# Patient Record
Sex: Female | Born: 1950 | Race: White | Hispanic: No | Marital: Married | Smoking: Never smoker
Health system: Southern US, Community
[De-identification: ages and names within clinical notes are randomized; demographics above are authoritative.]

## PROBLEM LIST (undated history)

## (undated) DIAGNOSIS — IMO0002 Reserved for concepts with insufficient information to code with codable children: Secondary | ICD-10-CM

## (undated) DIAGNOSIS — I10 Essential (primary) hypertension: Secondary | ICD-10-CM

## (undated) DIAGNOSIS — N870 Mild cervical dysplasia: Secondary | ICD-10-CM

## (undated) DIAGNOSIS — N200 Calculus of kidney: Secondary | ICD-10-CM

## (undated) HISTORY — DX: Mild cervical dysplasia: N87.0

## (undated) HISTORY — DX: Reserved for concepts with insufficient information to code with codable children: IMO0002

## (undated) HISTORY — PX: OTHER SURGICAL HISTORY: SHX169

## (undated) HISTORY — DX: Calculus of kidney: N20.0

## (undated) HISTORY — DX: Essential (primary) hypertension: I10

## (undated) HISTORY — PX: GYNECOLOGIC CRYOSURGERY: SHX857

---

## 1998-08-18 HISTORY — PX: ABDOMINAL HYSTERECTOMY: SHX81

## 1998-11-15 ENCOUNTER — Encounter: Payer: Self-pay | Admitting: Obstetrics and Gynecology

## 1998-11-20 ENCOUNTER — Inpatient Hospital Stay (HOSPITAL_COMMUNITY): Admission: RE | Admit: 1998-11-20 | Discharge: 1998-11-23 | Payer: Self-pay | Admitting: Obstetrics and Gynecology

## 1999-12-27 ENCOUNTER — Encounter: Admission: RE | Admit: 1999-12-27 | Discharge: 1999-12-27 | Payer: Self-pay | Admitting: Family Medicine

## 1999-12-27 ENCOUNTER — Encounter: Payer: Self-pay | Admitting: Family Medicine

## 2001-04-07 ENCOUNTER — Other Ambulatory Visit: Admission: RE | Admit: 2001-04-07 | Discharge: 2001-04-07 | Payer: Self-pay | Admitting: Obstetrics and Gynecology

## 2002-04-27 ENCOUNTER — Other Ambulatory Visit: Admission: RE | Admit: 2002-04-27 | Discharge: 2002-04-27 | Payer: Self-pay | Admitting: Obstetrics and Gynecology

## 2003-07-07 ENCOUNTER — Other Ambulatory Visit: Admission: RE | Admit: 2003-07-07 | Discharge: 2003-07-07 | Payer: Self-pay | Admitting: Obstetrics and Gynecology

## 2004-07-16 ENCOUNTER — Other Ambulatory Visit: Admission: RE | Admit: 2004-07-16 | Discharge: 2004-07-16 | Payer: Self-pay | Admitting: Obstetrics and Gynecology

## 2005-07-17 ENCOUNTER — Other Ambulatory Visit: Admission: RE | Admit: 2005-07-17 | Discharge: 2005-07-17 | Payer: Self-pay | Admitting: Obstetrics and Gynecology

## 2006-07-20 ENCOUNTER — Other Ambulatory Visit: Admission: RE | Admit: 2006-07-20 | Discharge: 2006-07-20 | Payer: Self-pay | Admitting: Obstetrics and Gynecology

## 2008-08-15 ENCOUNTER — Encounter: Payer: Self-pay | Admitting: Obstetrics and Gynecology

## 2008-08-15 ENCOUNTER — Ambulatory Visit: Payer: Self-pay | Admitting: Obstetrics and Gynecology

## 2008-08-15 ENCOUNTER — Other Ambulatory Visit: Admission: RE | Admit: 2008-08-15 | Discharge: 2008-08-15 | Payer: Self-pay | Admitting: Obstetrics and Gynecology

## 2008-09-11 ENCOUNTER — Ambulatory Visit: Payer: Self-pay | Admitting: Obstetrics and Gynecology

## 2009-09-04 ENCOUNTER — Other Ambulatory Visit: Admission: RE | Admit: 2009-09-04 | Discharge: 2009-09-04 | Payer: Self-pay | Admitting: Obstetrics and Gynecology

## 2009-09-04 ENCOUNTER — Ambulatory Visit: Payer: Self-pay | Admitting: Obstetrics and Gynecology

## 2010-09-07 ENCOUNTER — Emergency Department (HOSPITAL_BASED_OUTPATIENT_CLINIC_OR_DEPARTMENT_OTHER)
Admission: EM | Admit: 2010-09-07 | Discharge: 2010-09-07 | Payer: Self-pay | Source: Home / Self Care | Admitting: Emergency Medicine

## 2010-09-10 LAB — URINALYSIS, ROUTINE W REFLEX MICROSCOPIC
Bilirubin Urine: NEGATIVE
Ketones, ur: NEGATIVE mg/dL
Leukocytes, UA: NEGATIVE
Nitrite: NEGATIVE
Protein, ur: 30 mg/dL — AB
Specific Gravity, Urine: 1.031 — ABNORMAL HIGH (ref 1.005–1.030)
Urine Glucose, Fasting: NEGATIVE mg/dL
Urobilinogen, UA: 0.2 mg/dL (ref 0.0–1.0)
pH: 5.5 (ref 5.0–8.0)

## 2010-09-10 LAB — URINE MICROSCOPIC-ADD ON

## 2010-10-24 ENCOUNTER — Encounter (INDEPENDENT_AMBULATORY_CARE_PROVIDER_SITE_OTHER): Payer: BC Managed Care – PPO | Admitting: Obstetrics and Gynecology

## 2010-10-24 ENCOUNTER — Other Ambulatory Visit: Payer: Self-pay | Admitting: Obstetrics and Gynecology

## 2010-10-24 ENCOUNTER — Other Ambulatory Visit (HOSPITAL_COMMUNITY)
Admission: RE | Admit: 2010-10-24 | Discharge: 2010-10-24 | Disposition: A | Discharge status: Home / Self Care | Payer: BC Managed Care – PPO | Source: Ambulatory Visit | Attending: Obstetrics and Gynecology | Admitting: Obstetrics and Gynecology

## 2010-10-24 DIAGNOSIS — L659 Nonscarring hair loss, unspecified: Secondary | ICD-10-CM

## 2010-10-24 DIAGNOSIS — Z124 Encounter for screening for malignant neoplasm of cervix: Secondary | ICD-10-CM | POA: Insufficient documentation

## 2010-10-24 DIAGNOSIS — Z01419 Encounter for gynecological examination (general) (routine) without abnormal findings: Secondary | ICD-10-CM

## 2010-10-24 DIAGNOSIS — Z13220 Encounter for screening for lipoid disorders: Secondary | ICD-10-CM

## 2010-12-15 ENCOUNTER — Emergency Department (HOSPITAL_BASED_OUTPATIENT_CLINIC_OR_DEPARTMENT_OTHER)
Admission: EM | Admit: 2010-12-15 | Discharge: 2010-12-15 | Disposition: A | Discharge status: Home / Self Care | Payer: BC Managed Care – PPO | Attending: Emergency Medicine | Admitting: Emergency Medicine

## 2010-12-15 DIAGNOSIS — H81319 Aural vertigo, unspecified ear: Secondary | ICD-10-CM | POA: Insufficient documentation

## 2010-12-15 LAB — DIFFERENTIAL
Basophils Absolute: 0 10*3/uL (ref 0.0–0.1)
Basophils Relative: 0 % (ref 0–1)
Eosinophils Absolute: 0.1 10*3/uL (ref 0.0–0.7)
Eosinophils Relative: 1 % (ref 0–5)
Lymphocytes Relative: 27 % (ref 12–46)
Lymphs Abs: 2.8 10*3/uL (ref 0.7–4.0)
Monocytes Absolute: 0.8 10*3/uL (ref 0.1–1.0)
Monocytes Relative: 8 % (ref 3–12)
Neutro Abs: 6.6 10*3/uL (ref 1.7–7.7)
Neutrophils Relative %: 64 % (ref 43–77)

## 2010-12-15 LAB — CBC
HCT: 39.3 % (ref 36.0–46.0)
Hemoglobin: 13.3 g/dL (ref 12.0–15.0)
MCH: 29.3 pg (ref 26.0–34.0)
MCHC: 33.8 g/dL (ref 30.0–36.0)
MCV: 86.6 fL (ref 78.0–100.0)
Platelets: 292 10*3/uL (ref 150–400)
RBC: 4.54 MIL/uL (ref 3.87–5.11)
RDW: 12.6 % (ref 11.5–15.5)
WBC: 10.3 10*3/uL (ref 4.0–10.5)

## 2010-12-15 LAB — BASIC METABOLIC PANEL
BUN: 8 mg/dL (ref 6–23)
CO2: 28 mEq/L (ref 19–32)
Calcium: 8.8 mg/dL (ref 8.4–10.5)
Chloride: 103 mEq/L (ref 96–112)
Creatinine, Ser: 0.5 mg/dL (ref 0.4–1.2)
GFR calc Af Amer: >60 mL/min (ref >60)
GFR calc non Af Amer: >60 mL/min (ref >60)
Glucose, Bld: 98 mg/dL (ref 70–99)
Potassium: 3.5 mEq/L (ref 3.5–5.1)
Sodium: 143 mEq/L (ref 135–145)

## 2010-12-23 ENCOUNTER — Ambulatory Visit (INDEPENDENT_AMBULATORY_CARE_PROVIDER_SITE_OTHER): Payer: BC Managed Care – PPO | Admitting: Obstetrics and Gynecology

## 2010-12-23 DIAGNOSIS — R5381 Other malaise: Secondary | ICD-10-CM

## 2010-12-23 DIAGNOSIS — L659 Nonscarring hair loss, unspecified: Secondary | ICD-10-CM

## 2010-12-23 DIAGNOSIS — R5383 Other fatigue: Secondary | ICD-10-CM

## 2010-12-23 DIAGNOSIS — E559 Vitamin D deficiency, unspecified: Secondary | ICD-10-CM

## 2011-10-21 ENCOUNTER — Other Ambulatory Visit: Payer: Self-pay

## 2011-10-21 MED ORDER — TRIAMTERENE-HCTZ 37.5-25 MG PO TABS: 1.0000 | ORAL_TABLET | Freq: Every day | ORAL | Status: DC

## 2011-10-21 MED ORDER — ESTROGENS CONJUGATED 0.625 MG PO TABS: 0.6250 mg | ORAL_TABLET | Freq: Every day | ORAL | Status: DC

## 2011-11-04 ENCOUNTER — Other Ambulatory Visit: Payer: Self-pay

## 2011-11-04 ENCOUNTER — Encounter: Payer: Self-pay | Admitting: Obstetrics and Gynecology

## 2011-11-04 NOTE — Telephone Encounter (Signed)
SPOKE WITH GEORGETTE AT PHARMACY. PT. HAS REFILLS LEFT ON BOTH RX'S PT. NEEDS(PREMARIN & GENERIC MAXZIDE). SHE STATES SHE WILL FILL BOTH FOR PT. & I NOTIFIED PT. OF THIS INFO.

## 2011-11-11 ENCOUNTER — Encounter: Payer: Self-pay | Admitting: Gynecology

## 2011-11-11 DIAGNOSIS — N870 Mild cervical dysplasia: Secondary | ICD-10-CM | POA: Insufficient documentation

## 2011-11-20 ENCOUNTER — Encounter: Payer: Self-pay | Admitting: Obstetrics and Gynecology

## 2011-11-20 ENCOUNTER — Ambulatory Visit (INDEPENDENT_AMBULATORY_CARE_PROVIDER_SITE_OTHER): Payer: BC Managed Care – PPO | Admitting: Obstetrics and Gynecology

## 2011-11-20 ENCOUNTER — Other Ambulatory Visit (HOSPITAL_COMMUNITY)
Admission: RE | Admit: 2011-11-20 | Discharge: 2011-11-20 | Disposition: A | Discharge status: Home / Self Care | Payer: BC Managed Care – PPO | Source: Ambulatory Visit | Attending: Obstetrics and Gynecology | Admitting: Obstetrics and Gynecology

## 2011-11-20 VITALS — BP 120/74 | Ht 63.0 in | Wt 165.0 lb

## 2011-11-20 DIAGNOSIS — Z01419 Encounter for gynecological examination (general) (routine) without abnormal findings: Secondary | ICD-10-CM

## 2011-11-20 DIAGNOSIS — N809 Endometriosis, unspecified: Secondary | ICD-10-CM | POA: Insufficient documentation

## 2011-11-20 DIAGNOSIS — N200 Calculus of kidney: Secondary | ICD-10-CM | POA: Insufficient documentation

## 2011-11-20 DIAGNOSIS — Z833 Family history of diabetes mellitus: Secondary | ICD-10-CM

## 2011-11-20 LAB — CBC WITH DIFFERENTIAL/PLATELET
Basophils Absolute: 0 10*3/uL (ref 0.0–0.1)
Basophils Relative: 0 % (ref 0–1)
Eosinophils Absolute: 0.1 10*3/uL (ref 0.0–0.7)
Eosinophils Relative: 1 % (ref 0–5)
HCT: 42.4 % (ref 36.0–46.0)
Hemoglobin: 14 g/dL (ref 12.0–15.0)
Lymphocytes Relative: 21 % (ref 12–46)
Lymphs Abs: 1.9 10*3/uL (ref 0.7–4.0)
MCH: 29.6 pg (ref 26.0–34.0)
MCHC: 33 g/dL (ref 30.0–36.0)
MCV: 89.6 fL (ref 78.0–100.0)
Monocytes Absolute: 0.6 10*3/uL (ref 0.1–1.0)
Monocytes Relative: 7 % (ref 3–12)
Neutro Abs: 6.4 10*3/uL (ref 1.7–7.7)
Neutrophils Relative %: 71 % (ref 43–77)
Platelets: 306 10*3/uL (ref 150–400)
RBC: 4.73 MIL/uL (ref 3.87–5.11)
RDW: 13.1 % (ref 11.5–15.5)
WBC: 9 10*3/uL (ref 4.0–10.5)

## 2011-11-20 LAB — LIPID PANEL
Cholesterol: 230 mg/dL — ABNORMAL HIGH (ref 0–200)
HDL: 89 mg/dL (ref >39)
LDL Cholesterol: 116 mg/dL — ABNORMAL HIGH (ref 0–99)
Total CHOL/HDL Ratio: 2.6 Ratio
Triglycerides: 123 mg/dL (ref <150)
VLDL: 25 mg/dL (ref 0–40)

## 2011-11-20 LAB — COMPREHENSIVE METABOLIC PANEL
ALT: 9 U/L (ref 0–35)
AST: 11 U/L (ref 0–37)
Albumin: 4.2 g/dL (ref 3.5–5.2)
Alkaline Phosphatase: 77 U/L (ref 39–117)
BUN: 12 mg/dL (ref 6–23)
CO2: 30 mEq/L (ref 19–32)
Calcium: 9.4 mg/dL (ref 8.4–10.5)
Chloride: 99 mEq/L (ref 96–112)
Creat: 0.78 mg/dL (ref 0.50–1.10)
Glucose, Bld: 89 mg/dL (ref 70–99)
Potassium: 4.1 mEq/L (ref 3.5–5.3)
Sodium: 139 mEq/L (ref 135–145)
Total Bilirubin: 0.4 mg/dL (ref 0.3–1.2)
Total Protein: 7.2 g/dL (ref 6.0–8.3)

## 2011-11-20 MED ORDER — ESTROGENS CONJUGATED 0.625 MG PO TABS: 0.6250 mg | ORAL_TABLET | Freq: Every day | ORAL | Status: DC

## 2011-11-20 MED ORDER — TRIAMTERENE-HCTZ 37.5-25 MG PO TABS: 1.0000 | ORAL_TABLET | Freq: Every day | ORAL | Status: DC

## 2011-11-20 NOTE — Progress Notes (Signed)
Patient came to see me today for her annual GYN exam. She remains on estrogen with excellent results. She also takes diuretic daily. She is up-to-date on mammograms. She had a normal bone density here. She's having no vaginal bleeding. She is having no pelvic pain. She has never had a colonoscopy and she has a family history of colon cancer.  HEENT: Within normal limits. Kennon Portela present. Neck: No masses. Supraclavicular lymph nodes: Not enlarged. Breasts: Examined in both sitting and lying position. Symmetrical without skin changes or masses. Abdomen: Soft no masses guarding or rebound. No hernias. Pelvic: External within normal limits. BUS within normal limits. Vaginal examination shows good estrogen effect, no cystocele enterocele or rectocele. Cervix and uterus absent. Adnexa within normal limits. Rectovaginal confirmatory. Extremities within normal limits.  Assessment: Menopausal symptoms. Fluid retention.  Plan: Continue Premarin 0.625 mg daily. Continue yearly mammograms. Continue Maxzide. Strongly urged patient to get a colonoscopy.

## 2011-11-21 LAB — URINALYSIS W MICROSCOPIC + REFLEX CULTURE
Bacteria, UA: NONE SEEN
Bilirubin Urine: NEGATIVE
Casts: NONE SEEN
Crystals: NONE SEEN
Glucose, UA: NEGATIVE mg/dL
Hgb urine dipstick: NEGATIVE
Ketones, ur: NEGATIVE mg/dL
Leukocytes, UA: NEGATIVE
Nitrite: NEGATIVE
Protein, ur: NEGATIVE mg/dL
Specific Gravity, Urine: 1.015 (ref 1.005–1.030)
Urobilinogen, UA: 0.2 mg/dL (ref 0.0–1.0)
pH: 7.5 (ref 5.0–8.0)

## 2011-11-21 LAB — HEMOGLOBIN A1C
Hgb A1c MFr Bld: 5.6 % (ref <5.7)
Mean Plasma Glucose: 114 mg/dL (ref <117)

## 2012-09-10 ENCOUNTER — Other Ambulatory Visit: Payer: Self-pay | Admitting: Gynecology

## 2012-09-10 ENCOUNTER — Other Ambulatory Visit: Payer: BC Managed Care – PPO

## 2012-09-10 ENCOUNTER — Telehealth: Payer: Self-pay

## 2012-09-10 DIAGNOSIS — Z833 Family history of diabetes mellitus: Secondary | ICD-10-CM

## 2012-09-10 LAB — HEMOGLOBIN A1C
Hgb A1c MFr Bld: 5.5 % (ref <5.7)
Mean Plasma Glucose: 111 mg/dL (ref <117)

## 2012-09-10 NOTE — Telephone Encounter (Signed)
Patient had Hgb A1C and glucose checked at 11/2011 CE with Dr. Reece Agar. Labs were normal.  She would like to stop by and have it checked again if possible.  She does not have primary care MD. (We did discuss value of establishing with primary care at her age.)  She had a plantar wart removed from her foot Dec 16th and the area is not healing. Podiatrist put her foot in a cast yesterday.  Patient said he asked her was she diabetic and she told him April 2013 labs normal.  His question raised some concern with patient as her mom is diabetic and grandfather was double amputee secondary to diabetes.  Ok to come and have these labs drawn here?

## 2012-09-10 NOTE — Telephone Encounter (Signed)
Patient advised ok for labs here. Lab appt scheduled for this afternoon.

## 2012-09-10 NOTE — Telephone Encounter (Signed)
She can come in to have a hemoglobin A1c and the diagnosis would be family history of diabetes

## 2012-12-02 ENCOUNTER — Other Ambulatory Visit: Payer: Self-pay | Admitting: Obstetrics and Gynecology

## 2012-12-02 NOTE — Telephone Encounter (Signed)
Last CE 11/21/11 with Dr. Eda Paschal. He notes she takes diuretic daily for fluid retention.

## 2012-12-06 ENCOUNTER — Encounter: Payer: Self-pay | Admitting: *Deleted

## 2012-12-06 ENCOUNTER — Telehealth: Payer: Self-pay | Admitting: *Deleted

## 2012-12-06 MED ORDER — TRIAMTERENE-HCTZ 37.5-25 MG PO TABS: 1.0000 | ORAL_TABLET | Freq: Every day | ORAL | Status: DC

## 2012-12-06 NOTE — Telephone Encounter (Signed)
rx sent pt informed that she should find a PCP to give her additional refills.

## 2012-12-06 NOTE — Telephone Encounter (Signed)
Pt called because her medication for Maxzide was denied on 12/02/12. Pt has annual schedule on 12/16/12 with you, she asked if you would fill 1 time until annual visit? Dr.G said pt takes diuretic daily, Please advise

## 2012-12-06 NOTE — Telephone Encounter (Signed)
Maxide #30. No refill. Inform patient I will give her enough to cover her but she will need to followup with a primary care physician. I see Dr. Eda Paschal had ordered this but he has no indication in the chart.  If she needs to be on a diuretic daily then she needs to be followed by a primary care physician. Many reasons for swelling to include renal/heart disease and hypertension. All of these should be followed by a primary care physician and gynecologist.

## 2012-12-16 ENCOUNTER — Encounter: Payer: Self-pay | Admitting: Gynecology

## 2012-12-16 ENCOUNTER — Ambulatory Visit (INDEPENDENT_AMBULATORY_CARE_PROVIDER_SITE_OTHER): Payer: BC Managed Care – PPO | Admitting: Gynecology

## 2012-12-16 ENCOUNTER — Telehealth: Payer: Self-pay | Admitting: Gynecology

## 2012-12-16 VITALS — BP 120/78 | Ht 63.0 in | Wt 166.0 lb

## 2012-12-16 DIAGNOSIS — Z01419 Encounter for gynecological examination (general) (routine) without abnormal findings: Secondary | ICD-10-CM

## 2012-12-16 DIAGNOSIS — L293 Anogenital pruritus, unspecified: Secondary | ICD-10-CM

## 2012-12-16 DIAGNOSIS — Z13220 Encounter for screening for lipoid disorders: Secondary | ICD-10-CM

## 2012-12-16 LAB — WET PREP FOR TRICH, YEAST, CLUE
Clue Cells Wet Prep HPF POC: NONE SEEN
Trich, Wet Prep: NONE SEEN
WBC, Wet Prep HPF POC: NONE SEEN
Yeast Wet Prep HPF POC: NONE SEEN

## 2012-12-16 LAB — COMPREHENSIVE METABOLIC PANEL
ALT: 8 U/L (ref 0–35)
AST: 10 U/L (ref 0–37)
Albumin: 4 g/dL (ref 3.5–5.2)
Alkaline Phosphatase: 77 U/L (ref 39–117)
BUN: 12 mg/dL (ref 6–23)
CO2: 30 mEq/L (ref 19–32)
Calcium: 9.9 mg/dL (ref 8.4–10.5)
Chloride: 100 mEq/L (ref 96–112)
Creat: 0.78 mg/dL (ref 0.50–1.10)
Glucose, Bld: 108 mg/dL — ABNORMAL HIGH (ref 70–99)
Potassium: 3.9 mEq/L (ref 3.5–5.3)
Sodium: 138 mEq/L (ref 135–145)
Total Bilirubin: 0.3 mg/dL (ref 0.3–1.2)
Total Protein: 7 g/dL (ref 6.0–8.3)

## 2012-12-16 LAB — CBC WITH DIFFERENTIAL/PLATELET
Basophils Absolute: 0.1 10*3/uL (ref 0.0–0.1)
Basophils Relative: 1 % (ref 0–1)
Eosinophils Absolute: 0.1 10*3/uL (ref 0.0–0.7)
Eosinophils Relative: 1 % (ref 0–5)
HCT: 40.6 % (ref 36.0–46.0)
Hemoglobin: 13.6 g/dL (ref 12.0–15.0)
Lymphocytes Relative: 21 % (ref 12–46)
Lymphs Abs: 2.3 10*3/uL (ref 0.7–4.0)
MCH: 28.5 pg (ref 26.0–34.0)
MCHC: 33.5 g/dL (ref 30.0–36.0)
MCV: 84.9 fL (ref 78.0–100.0)
Monocytes Absolute: 0.8 10*3/uL (ref 0.1–1.0)
Monocytes Relative: 8 % (ref 3–12)
Neutro Abs: 7.9 10*3/uL — ABNORMAL HIGH (ref 1.7–7.7)
Neutrophils Relative %: 69 % (ref 43–77)
Platelets: 347 10*3/uL (ref 150–400)
RBC: 4.78 MIL/uL (ref 3.87–5.11)
RDW: 13.2 % (ref 11.5–15.5)
WBC: 11.2 10*3/uL — ABNORMAL HIGH (ref 4.0–10.5)

## 2012-12-16 LAB — TSH: TSH: 2.139 u[IU]/mL (ref 0.350–4.500)

## 2012-12-16 LAB — LIPID PANEL
Cholesterol: 225 mg/dL — ABNORMAL HIGH (ref 0–200)
HDL: 94 mg/dL (ref >39)
LDL Cholesterol: 103 mg/dL — ABNORMAL HIGH (ref 0–99)
Total CHOL/HDL Ratio: 2.4 Ratio
Triglycerides: 142 mg/dL (ref <150)
VLDL: 28 mg/dL (ref 0–40)

## 2012-12-16 MED ORDER — TRIAMTERENE-HCTZ 37.5-25 MG PO TABS: 1.0000 | ORAL_TABLET | Freq: Every day | ORAL | Status: AC

## 2012-12-16 MED ORDER — ESTROGENS CONJUGATED 0.625 MG PO TABS: ORAL_TABLET | ORAL | Status: DC

## 2012-12-16 MED ORDER — FLUCONAZOLE 150 MG PO TABS: 150.0000 mg | ORAL_TABLET | Freq: Once | ORAL | Status: DC

## 2012-12-16 NOTE — Telephone Encounter (Signed)
Call patient. I forgot at her office visit to stress the need for her ranging a screening colonoscopy. She has never had one and we recommend starting at age 62 so she is way overdue. She can do this through the [facility] gastroenterology.

## 2012-12-16 NOTE — Patient Instructions (Signed)
Schedule a colonoscopy with [facility] or [facility] gastroenterology. Screening colonoscopy is recommended at age 62 and you are way overdue. If you have any issues or ranging this call my office.  Make an appointment with a primary physician for general health exam. You can also do this with [facility]  Followup with me in one year

## 2012-12-16 NOTE — Progress Notes (Signed)
Amanda Pollard 1951-03-02 [id number]        62 y.o.  G3P3003 for annual exam.  Several issues noted below. Former patient of Dr. Verl Dicker.  Past medical history,surgical history, medications, allergies, family history and social history were all reviewed and documented in the EPIC chart. ROS:  Was performed and pertinent positives and negatives are included in the history.  Exam: Kim assistant Filed Vitals:   12/16/12 1523  BP: 120/78  Height: 5\' 3"  (1.6 m)  Weight: 166 lb (75.297 kg)   General appearance  Normal Skin grossly normal Head/Neck normal with no cervical or supraclavicular adenopathy thyroid normal Lungs  clear Cardiac RR, without RMG Abdominal  soft, nontender, without masses, organomegaly or hernia Breasts  examined lying and sitting without masses, retractions, discharge or axillary adenopathy. Pelvic  Ext/BUS/vagina  normal atrophic genital changes  Adnexa  Without masses or tenderness    Anus and perineum  normal   Rectovaginal  normal sphincter tone without palpated masses or tenderness.    Assessment/Plan:  62 y.o. G3P3003 female for annual exam.   1. HRT. Patient is on Premarin 0.625 daily. Has been on this for some time with good symptom relief. Has tried to wean and had return of unacceptable hot flushes. Status post TAH 2004 irregular bleeding/endometriosis does have past history of dysplasia but pathology report showed normal cervix. I reviewed the whole issue of HRT. I discussed the WHI study with increased risk of stroke heart attack DVT breast cancer. ACOG and NAMS statements for lowest dose for shortness. Time reviewed. Options to try to wean versus continuing discussed and she would prefer to continue accepts the risks. I refilled her Premarin 0.625x1 year. Options to switch to another form such as estradiol was reviewed but declined. 2. Vulvar pruritus. Patient has occasional intermittent vulvar pruritus that she uses OTC antifungal or steroid  cream for. Her exam is normal today. I did give her 1 Diflucan 150 to take prophylactically now to see if this does eradicate any low level vaginal yeast. Wet prep was done which was unremarkable. 3. Pap smear 11/2011. No Pap smear done today. 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 Hysterectomy specimen showed no dysplasia on the cervix. Options to stop screening altogether versus less frequent screening interval reviewed and we will readdress this on an annual basis. 4. Mammography today. Continue with annual mammography. SBE monthly reviewed. 5. DEXA 2010 normal. Recommend repeat five-year interval. Check vitamin D level today. 6. Swelling/Maxzide use. Patient has been on medication for a number of years initially by Dr. Katrinka Blazing and then by Dr. Eda Paschal. No diagnoses for hypertension or other disease. I reviewed with her that I am uncomfortable with her continuing on the daily use of a diuretic without indications. She does not have a primary physician and I recommended that she establish care with her primary physician not only for ongoing health care but to be evaluated as far as this is concerned. I wrote her a prescription for 30 days to continue but have asked her to wean herself off of the Maxide check her blood pressure make sure that it remains normal and if she does have issues with swelling then she needs to see a primary physician for evaluation. Also check electrolytes today. 7. Colonoscopy never. I did not address this at the office visit and will contact her to strongly recommend she schedule a screening colonoscopy. I will also include this in her AVS that will be mailed  to her. 8. Health maintenance. Baseline CBC comprehensive metabolic panel lipid profile TSH urinalysis vitamin D ordered. Followup one year, sooner as needed.    Dara Lords MD, 5:01 PM 12/16/2012

## 2012-12-17 ENCOUNTER — Encounter: Payer: Self-pay | Admitting: Obstetrics and Gynecology

## 2012-12-17 ENCOUNTER — Encounter: Payer: Self-pay | Admitting: Gynecology

## 2012-12-17 ENCOUNTER — Other Ambulatory Visit: Payer: Self-pay | Admitting: Gynecology

## 2012-12-17 DIAGNOSIS — E7800 Pure hypercholesterolemia, unspecified: Secondary | ICD-10-CM

## 2012-12-17 DIAGNOSIS — O99810 Abnormal glucose complicating pregnancy: Secondary | ICD-10-CM

## 2012-12-17 DIAGNOSIS — R7309 Other abnormal glucose: Secondary | ICD-10-CM

## 2012-12-17 LAB — URINALYSIS W MICROSCOPIC + REFLEX CULTURE
Bilirubin Urine: NEGATIVE
Glucose, UA: NEGATIVE mg/dL
Hgb urine dipstick: NEGATIVE
Ketones, ur: NEGATIVE mg/dL
Leukocytes, UA: NEGATIVE
Nitrite: NEGATIVE
Protein, ur: NEGATIVE mg/dL
Specific Gravity, Urine: 1.023 (ref 1.005–1.030)
Urobilinogen, UA: 0.2 mg/dL (ref 0.0–1.0)
pH: 6 (ref 5.0–8.0)

## 2012-12-17 LAB — VITAMIN D 25 HYDROXY (VIT D DEFICIENCY, FRACTURES): Vit D, 25-Hydroxy: 35 ng/mL (ref 30–89)

## 2012-12-17 NOTE — Telephone Encounter (Signed)
Pt informed with the below note, pt said she will get colonoscopy done this year.

## 2012-12-20 ENCOUNTER — Other Ambulatory Visit: Payer: BC Managed Care – PPO

## 2012-12-20 DIAGNOSIS — R7309 Other abnormal glucose: Secondary | ICD-10-CM

## 2012-12-20 DIAGNOSIS — E7800 Pure hypercholesterolemia, unspecified: Secondary | ICD-10-CM

## 2012-12-20 LAB — URINE CULTURE: Colony Count: 55000

## 2012-12-21 LAB — LIPID PANEL
Cholesterol: 210 mg/dL — ABNORMAL HIGH (ref 0–200)
HDL: 93 mg/dL (ref >39)
LDL Cholesterol: 94 mg/dL (ref 0–99)
Total CHOL/HDL Ratio: 2.3 Ratio
Triglycerides: 113 mg/dL (ref <150)
VLDL: 23 mg/dL (ref 0–40)

## 2012-12-21 LAB — GLUCOSE, FASTING: Glucose, Fasting: 90 mg/dL (ref 70–99)

## 2013-05-13 ENCOUNTER — Other Ambulatory Visit: Payer: Self-pay | Admitting: Dermatology

## 2013-06-03 ENCOUNTER — Other Ambulatory Visit: Payer: Self-pay | Admitting: Dermatology

## 2013-06-23 ENCOUNTER — Other Ambulatory Visit: Payer: Self-pay

## 2013-08-08 ENCOUNTER — Other Ambulatory Visit: Payer: Self-pay | Admitting: Dermatology

## 2013-12-12 ENCOUNTER — Other Ambulatory Visit: Payer: Self-pay | Admitting: Dermatology

## 2013-12-21 ENCOUNTER — Encounter: Payer: Self-pay | Admitting: Gynecology

## 2014-01-11 ENCOUNTER — Encounter: Payer: Self-pay | Admitting: Cardiology

## 2014-01-23 ENCOUNTER — Other Ambulatory Visit (HOSPITAL_COMMUNITY)
Admission: RE | Admit: 2014-01-23 | Discharge: 2014-01-23 | Disposition: A | Discharge status: Home / Self Care | Payer: BC Managed Care – PPO | Source: Ambulatory Visit | Attending: Gynecology | Admitting: Gynecology

## 2014-01-23 ENCOUNTER — Encounter: Payer: Self-pay | Admitting: Gynecology

## 2014-01-23 ENCOUNTER — Ambulatory Visit (INDEPENDENT_AMBULATORY_CARE_PROVIDER_SITE_OTHER): Payer: BC Managed Care – PPO | Admitting: Gynecology

## 2014-01-23 VITALS — BP 118/74 | Ht 63.0 in | Wt 173.0 lb

## 2014-01-23 DIAGNOSIS — Z01419 Encounter for gynecological examination (general) (routine) without abnormal findings: Secondary | ICD-10-CM | POA: Insufficient documentation

## 2014-01-23 DIAGNOSIS — Z79890 Hormone replacement therapy (postmenopausal): Secondary | ICD-10-CM

## 2014-01-23 DIAGNOSIS — N952 Postmenopausal atrophic vaginitis: Secondary | ICD-10-CM

## 2014-01-23 MED ORDER — ESTROGENS CONJUGATED 0.625 MG PO TABS: ORAL_TABLET | ORAL | Status: DC

## 2014-01-23 NOTE — Addendum Note (Signed)
Addended by: Nelva Nay on: 01/23/2014 03:10 PM   Modules accepted: Orders

## 2014-01-23 NOTE — Progress Notes (Signed)
Amanda Pollard 12-07-1950 [id number]        63 y.o.  G3P3003 for annual exam.  Several issues noted below.  Past medical history,surgical history, problem list, medications, allergies, family history and social history were all reviewed and documented as reviewed in the EPIC chart.  ROS:  12 system ROS performed with pertinent positives and negatives included in the history, assessment and plan.  Included Systems: General, HEENT, Neck, Cardiovascular, Pulmonary, Gastrointestinal, Genitourinary, Musculoskeletal, Dermatologic, Endocrine, Hematological, Neurologic, Psychiatric Additional significant findings :  Squamous cell carcinoma of the skin as discussed below.   Exam: Kim assistant Filed Vitals:   01/23/14 1430  BP: 118/74  Height: 5\' 3"  (1.6 m)  Weight: 173 lb (78.472 kg)   General appearance:  Normal affect, orientation and appearance. Skin: Grossly normal HEENT: Without gross lesions.  No cervical or supraclavicular adenopathy. Thyroid normal.  Lungs:  Clear without wheezing, rales or rhonchi Cardiac: RR, without RMG Abdominal:  Soft, nontender, without masses, guarding, rebound, organomegaly or hernia Breasts:  Examined lying and sitting without masses, retractions, discharge or axillary adenopathy. Pelvic:  Ext/BUS/vagina with generalized atrophic changes. Pap of cuff done.  Adnexa  Without masses or tenderness    Anus and perineum  Normal   Rectovaginal  Normal sphincter tone without palpated masses or tenderness.    Assessment/Plan:  63 y.o. G3P3003 female for annual exam.   1. Postmenopausal/HRT. Patient continues on Premarin 0.625 mg. Status post TAH 2004 for irregular bleeding/endometriosis. Had tried to wean previously from ERT with unacceptable hot flushes. I again reviewed the risks to include the WHI study with increased risk of stroke heart attack DVT and breast cancer. The ACOG and NAMS statements for lowest dose for shortest period of time reviewed. Patient will  try to wean again this coming fall. I refilled her times a year in the event that she is unable to do this. 2. Pap smear 2013. History of cryosurgery a number of years ago. Status post TAH with pathology showing a benign cervix. Options to stop screening versus less frequent screening intervals reviewed. Recently was diagnosed with several areas of squamous cell carcinoma of the skin being treated by dermatology. Patient is very concerned about this and requested a Pap smear and I went ahead and did a Pap smear of the vaginal cuff. 3. Mammography 12/2013. Continue with annual mammography. SBE monthly reviewed. 4. DEXA 2010 normal. Will repeat at age 1. Increase calcium and vitamin D recommendations reviewed. Vitamin D level 35 last year. 5. Colonoscopy never. I again stressed the need for colonoscopy. Her father did have colon cancer and the benefits of precancerous polyp removal and early detection reviewed. Patient clearly understands my recommendations and agrees to schedule. Names provided. 6. Health maintenance. Patient recently established care with a primary physician who started treating her for hypertension with lisinopril. Routine blood work reportedly done through their office. No routine blood work done today. Followup in one year, sooner as needed.   Note: This document was prepared with digital dictation and possible smart phrase technology. Any transcriptional errors that result from this process are unintentional.   Anastasio Auerbach MD, 2:49 PM 01/23/2014

## 2014-01-23 NOTE — Patient Instructions (Addendum)
Try to wean off of the hormones this coming fall as we discussed. Call me if you have any issues with this.  Schedule colonoscopy with [facility] gastroenterology at [phone] or [facility] at [phone]   You may obtain a copy of any labs that were done today by logging onto MyChart as outlined in the instructions provided with your AVS (after visit summary). The office will not call with normal lab results but certainly if there are any significant abnormalities then we will contact you.   Health Maintenance, Female A healthy lifestyle and preventative care can promote health and wellness.  Maintain regular health, dental, and eye exams.  Eat a healthy diet. Foods like vegetables, fruits, whole grains, low-fat dairy products, and lean protein foods contain the nutrients you need without too many calories. Decrease your intake of foods high in solid fats, added sugars, and salt. Get information about a proper diet from your caregiver, if necessary.  Regular physical exercise is one of the most important things you can do for your health. Most adults should get at least 150 minutes of moderate-intensity exercise (any activity that increases your heart rate and causes you to sweat) each week. In addition, most adults need muscle-strengthening exercises on 2 or more days a week.   Maintain a healthy weight. The body mass index (BMI) is a screening tool to identify possible weight problems. It provides an estimate of body fat based on height and weight. Your caregiver can help determine your BMI, and can help you achieve or maintain a healthy weight. For adults 20 years and older:  A BMI below 18.5 is considered underweight.  A BMI of 18.5 to 24.9 is normal.  A BMI of 25 to 29.9 is considered overweight.  A BMI of 30 and above is considered obese.  Maintain normal blood lipids and cholesterol by exercising and minimizing your intake of saturated fat. Eat a balanced diet with  plenty of fruits and vegetables. Blood tests for lipids and cholesterol should begin at age 55 and be repeated every 5 years. If your lipid or cholesterol levels are high, you are over 50, or you are a high risk for heart disease, you may need your cholesterol levels checked more frequently.Ongoing high lipid and cholesterol levels should be treated with medicines if diet and exercise are not effective.  If you smoke, find out from your caregiver how to quit. If you do not use tobacco, do not start.  Lung cancer screening is recommended for adults aged 77 80 years who are at high risk for developing lung cancer because of a history of smoking. Yearly low-dose computed tomography (CT) is recommended for people who have at least a 30-pack-year history of smoking and are a current smoker or have quit within the past 15 years. A pack year of smoking is smoking an average of 1 pack of cigarettes a day for 1 year (for example: 1 pack a day for 30 years or 2 packs a day for 15 years). Yearly screening should continue until the smoker has stopped smoking for at least 15 years. Yearly screening should also be stopped for people who develop a health problem that would prevent them from having lung cancer treatment.  If you are pregnant, do not drink alcohol. If you are breastfeeding, be very cautious about drinking alcohol. If you are not pregnant and choose to drink alcohol, do not exceed 1 drink per day. One drink is considered to be 12 ounces (355 mL) of  beer, 5 ounces (148 mL) of wine, or 1.5 ounces (44 mL) of liquor.  Avoid use of street drugs. Do not share needles with anyone. Ask for help if you need support or instructions about stopping the use of drugs.  High blood pressure causes heart disease and increases the risk of stroke. Blood pressure should be checked at least every 1 to 2 years. Ongoing high blood pressure should be treated with medicines, if weight loss and exercise are not effective.  If you  are 25 to 64 years old, ask your caregiver if you should take aspirin to prevent strokes.  Diabetes screening involves taking a blood sample to check your fasting blood sugar level. This should be done once every 3 years, after age 68, if you are within normal weight and without risk factors for diabetes. Testing should be considered at a younger age or be carried out more frequently if you are overweight and have at least 1 risk factor for diabetes.  Breast cancer screening is essential preventative care for women. You should practice "breast self-awareness." This means understanding the normal appearance and feel of your breasts and may include breast self-examination. Any changes detected, no matter how small, should be reported to a caregiver. Women in their 60s and 30s should have a clinical breast exam (CBE) by a caregiver as part of a regular health exam every 1 to 3 years. After age 69, women should have a CBE every year. Starting at age 64, women should consider having a mammogram (breast X-ray) every year. Women who have a family history of breast cancer should talk to their caregiver about genetic screening. Women at a high risk of breast cancer should talk to their caregiver about having an MRI and a mammogram every year.  Breast cancer gene (BRCA)-related cancer risk assessment is recommended for women who have family members with BRCA-related cancers. BRCA-related cancers include breast, ovarian, tubal, and peritoneal cancers. Having family members with these cancers may be associated with an increased risk for harmful changes (mutations) in the breast cancer genes BRCA1 and BRCA2. Results of the assessment will determine the need for genetic counseling and BRCA1 and BRCA2 testing.  The Pap test is a screening test for cervical cancer. Women should have a Pap test starting at age 30. Between ages 23 and 66, Pap tests should be repeated every 2 years. Beginning at age 75, you should have a Pap  test every 3 years as long as the past 3 Pap tests have been normal. If you had a hysterectomy for a problem that was not cancer or a condition that could lead to cancer, then you no longer need Pap tests. If you are between ages 7 and 34, and you have had normal Pap tests going back 10 years, you no longer need Pap tests. If you have had past treatment for cervical cancer or a condition that could lead to cancer, you need Pap tests and screening for cancer for at least 20 years after your treatment. If Pap tests have been discontinued, risk factors (such as a new sexual partner) need to be reassessed to determine if screening should be resumed. Some women have medical problems that increase the chance of getting cervical cancer. In these cases, your caregiver may recommend more frequent screening and Pap tests.  The human papillomavirus (HPV) test is an additional test that may be used for cervical cancer screening. The HPV test looks for the virus that can cause the cell changes on  the cervix. The cells collected during the Pap test can be tested for HPV. The HPV test could be used to screen women aged 32 years and older, and should be used in women of any age who have unclear Pap test results. After the age of 53, women should have HPV testing at the same frequency as a Pap test.  Colorectal cancer can be detected and often prevented. Most routine colorectal cancer screening begins at the age of 65 and continues through age 61. However, your caregiver may recommend screening at an earlier age if you have risk factors for colon cancer. On a yearly basis, your caregiver may provide home test kits to check for hidden blood in the stool. Use of a small camera at the end of a tube, to directly examine the colon (sigmoidoscopy or colonoscopy), can detect the earliest forms of colorectal cancer. Talk to your caregiver about this at age 67, when routine screening begins. Direct examination of the colon should be  repeated every 5 to 10 years through age 69, unless early forms of pre-cancerous polyps or small growths are found.  Hepatitis C blood testing is recommended for all people born from [birth year] through 1966 and any individual with known risks for hepatitis C.  Practice safe sex. Use condoms and avoid high-risk sexual practices to reduce the spread of sexually transmitted infections (STIs). Sexually active women aged 67 and younger should be checked for Chlamydia, which is a common sexually transmitted infection. Older women with new or multiple partners should also be tested for Chlamydia. Testing for other STIs is recommended if you are sexually active and at increased risk.  Osteoporosis is a disease in which the bones lose minerals and strength with aging. This can result in serious bone fractures. The risk of osteoporosis can be identified using a bone density scan. Women ages 29 and over and women at risk for fractures or osteoporosis should discuss screening with their caregivers. Ask your caregiver whether you should be taking a calcium supplement or vitamin D to reduce the rate of osteoporosis.  Menopause can be associated with physical symptoms and risks. Hormone replacement therapy is available to decrease symptoms and risks. You should talk to your caregiver about whether hormone replacement therapy is right for you.  Use sunscreen. Apply sunscreen liberally and repeatedly throughout the day. You should seek shade when your shadow is shorter than you. Protect yourself by wearing long sleeves, pants, a wide-brimmed hat, and sunglasses year round, whenever you are outdoors.  Notify your caregiver of new moles or changes in moles, especially if there is a change in shape or color. Also notify your caregiver if a mole is larger than the size of a pencil eraser.  Stay current with your immunizations. Document Released: 02/17/2011 Document Revised: 11/29/2012 Document Reviewed: 02/17/2011 [facility]  Patient Information 2014 [employer].

## 2014-01-24 ENCOUNTER — Encounter: Payer: Self-pay | Admitting: Cardiology

## 2014-01-24 ENCOUNTER — Ambulatory Visit (INDEPENDENT_AMBULATORY_CARE_PROVIDER_SITE_OTHER): Payer: BC Managed Care – PPO | Admitting: Cardiology

## 2014-01-24 VITALS — BP 118/78 | HR 65 | Ht 63.5 in | Wt 171.0 lb

## 2014-01-24 DIAGNOSIS — I10 Essential (primary) hypertension: Secondary | ICD-10-CM

## 2014-01-24 LAB — URINALYSIS W MICROSCOPIC + REFLEX CULTURE
Bacteria, UA: NONE SEEN
Bilirubin Urine: NEGATIVE
Casts: NONE SEEN
Crystals: NONE SEEN
Glucose, UA: NEGATIVE mg/dL
Hgb urine dipstick: NEGATIVE
Ketones, ur: NEGATIVE mg/dL
Leukocytes, UA: NEGATIVE
Nitrite: NEGATIVE
Protein, ur: NEGATIVE mg/dL
Specific Gravity, Urine: 1.015 (ref 1.005–1.030)
Urobilinogen, UA: 0.2 mg/dL (ref 0.0–1.0)
pH: 6.5 (ref 5.0–8.0)

## 2014-01-24 NOTE — Patient Instructions (Signed)
The current medical regimen is effective;  continue present plan and medications.  Your physician has requested that you have a renal artery duplex. During this test, an ultrasound is used to evaluate blood flow to the kidneys. Allow one hour for this exam. Do not eat after midnight the day before and avoid carbonated beverages. Take your medications as you usually do.  Follow up as needed.

## 2014-01-24 NOTE — Progress Notes (Signed)
HPI The patient has no past cardiac history other than some atypical chest pain years ago.  She has had some recent headaches and has been noted to have HTN with BPs in the 170/90 range.  Because of this she was started on lisinopril.  She had after taking the first 10 mg dose a sudden drop in her BP with symptoms.  She subsequently had her dose of lisinopril dropped and she has had good blood pressure readings since then.  The patient denies any new symptoms such as chest discomfort, neck or arm discomfort. There has been no new shortness of breath, PND or orthopnea. There have been no reported palpitations.  She has been walking on the treadmill for exercise.    No Known Allergies  Current Outpatient Prescriptions  Medication Sig Dispense Refill  . estrogens, conjugated, (PREMARIN) 0.625 MG tablet take 1 tablet by mouth once daily  30 tablet  12  . lisinopril (PRINIVIL,ZESTRIL) 5 MG tablet Take 5 mg by mouth daily.      . Multiple Vitamin (MULTIVITAMIN) tablet Take 1 tablet by mouth daily.      Marland Kitchen triamterene-hydrochlorothiazide (MAXZIDE-25) 37.5-25 MG per tablet Take 1 tablet by mouth daily.  30 tablet  0   No current facility-administered medications for this visit.    Past Medical History  Diagnosis Date  . CIN I (cervical intraepithelial neoplasia I)   . Endometriosis   . Kidney stones   . Squamous cell carcinoma     Past Surgical History  Procedure Laterality Date  . Gynecologic cryosurgery    . Abdominal hysterectomy  2000    TAH  . Squamous cell excised      Family History  Problem Relation Age of Onset  . Hypertension Mother   . Diabetes Mother   . Cancer Father     Colon cancer and Prostate  . Alzheimer's disease Father   . Heart disease Maternal Grandmother   . Heart disease Paternal Grandmother     History   Social History  . Marital Status: Married    Spouse Name: N/A    Number of Children: 3  . Years of Education: N/A   Occupational History  .  HOMEMAKER    Social History Main Topics  . Smoking status: Never Smoker   . Smokeless tobacco: Not on file  . Alcohol Use: No  . Drug Use: No  . Sexual Activity: Yes    Birth Control/ Protection: Surgical   Other Topics Concern  . Not on file   Social History Narrative   Lives at home with husband.      ROS:  As stated in the HPI and negative for all other systems.  PHYSICAL EXAM BP 118/78  Pulse 65  Ht 5' 3.5" (1.613 m)  Wt 171 lb (77.565 kg)  BMI 29.81 kg/m2 GENERAL:  Well appearing HEENT:  Pupils equal round and reactive, fundi not visualized, oral mucosa unremarkable NECK:  No jugular venous distention, waveform within normal limits, carotid upstroke brisk and symmetric, no bruits, no thyromegaly LYMPHATICS:  No cervical, inguinal adenopathy LUNGS:  Clear to auscultation bilaterally BACK:  No CVA tenderness CHEST:  Unremarkable HEART:  PMI not displaced or sustained,S1 and S2 within normal limits, no S3, no S4, no clicks, no rubs, no murmurs ABD:  Flat, positive bowel sounds normal in frequency in pitch, no bruits, no rebound, no guarding, no midline pulsatile mass, no hepatomegaly, no splenomegaly EXT:  2 plus pulses throughout, no edema, no cyanosis  no clubbing SKIN:  No rashes no nodules NEURO:  Cranial nerves II through XII grossly intact, motor grossly intact throughout PSYCH:  Cognitively intact, oriented to person place and time  EKG:  Sinus rhythm, rate 65, axis within normal limits, intervals within normal limits, no acute ST-T wave changes.  01/24/2014  ASSESSMENT AND PLAN   HTN:  Given the sudden drop in her BP with lisinopril we will check a renal Doppler.  However, I would otherwise not suggest further work up if this is normal.  Of note I did take this opportunity to discuss exercise as further treatment for her BP.

## 2014-01-25 LAB — CYTOLOGY - PAP

## 2014-01-30 ENCOUNTER — Ambulatory Visit (HOSPITAL_COMMUNITY): Payer: BC Managed Care – PPO | Attending: Cardiology | Admitting: Cardiology

## 2014-01-30 DIAGNOSIS — I10 Essential (primary) hypertension: Secondary | ICD-10-CM | POA: Insufficient documentation

## 2014-01-30 NOTE — Progress Notes (Signed)
Renal artery duplex completed 

## 2014-02-06 ENCOUNTER — Telehealth: Payer: Self-pay | Admitting: Cardiology

## 2014-02-06 NOTE — Telephone Encounter (Signed)
Reviewed results of doppler with pt.

## 2014-02-06 NOTE — Telephone Encounter (Signed)
New message  ° ° °Patient calling for test results.   °

## 2014-06-19 ENCOUNTER — Encounter: Payer: Self-pay | Admitting: Cardiology

## 2014-12-07 ENCOUNTER — Encounter: Payer: Self-pay | Admitting: Cardiology

## 2014-12-07 ENCOUNTER — Ambulatory Visit (INDEPENDENT_AMBULATORY_CARE_PROVIDER_SITE_OTHER): Payer: BLUE CROSS/BLUE SHIELD | Admitting: Cardiology

## 2014-12-07 VITALS — BP 150/82 | HR 72 | Ht 63.0 in | Wt 173.5 lb

## 2014-12-07 DIAGNOSIS — I493 Ventricular premature depolarization: Secondary | ICD-10-CM | POA: Diagnosis not present

## 2014-12-07 DIAGNOSIS — R0989 Other specified symptoms and signs involving the circulatory and respiratory systems: Secondary | ICD-10-CM | POA: Insufficient documentation

## 2014-12-07 NOTE — Progress Notes (Signed)
HPI The patient presents for evaluation of PVCs. These were noted recently on EKG. I previously seen her for hypertension. She had some labile blood pressures and she was treated with Maxide although she stopped this. She wants to try to treat this without medications. She does notice the palpitations occasionally. She notices them once after running down her stairs to help her dog who was throwing up. She feels them occasionally has a skipped beat. She does not feel any sustained episodes or rapid rhythms. She does not describe any presyncope or syncope. She's not having any chest pressure, neck or arm discomfort. She did have increased stress with some of this has resolved. She still has some.  No Known Allergies  Current Outpatient Prescriptions  Medication Sig Dispense Refill  . estrogens, conjugated, (PREMARIN) 0.625 MG tablet take 1 tablet by mouth once daily 30 tablet 12  . Multiple Vitamin (MULTIVITAMIN) tablet Take 1 tablet by mouth daily.    Marland Kitchen triamterene-hydrochlorothiazide (MAXZIDE-25) 37.5-25 MG per tablet Take 1 tablet by mouth daily. 30 tablet 0   No current facility-administered medications for this visit.    Past Medical History  Diagnosis Date  . CIN I (cervical intraepithelial neoplasia I)   . Endometriosis   . Kidney stones   . Squamous cell carcinoma     Past Surgical History  Procedure Laterality Date  . Gynecologic cryosurgery    . Abdominal hysterectomy  2000    TAH  . Squamous cell excised      Family History  Problem Relation Age of Onset  . Hypertension Mother   . Diabetes Mother   . Cancer Father     Colon cancer and Prostate  . Alzheimer's disease Father   . Heart disease Maternal Grandmother   . Heart disease Paternal Grandmother     History   Social History  . Marital Status: Married    Spouse Name: N/A  . Number of Children: 3  . Years of Education: N/A   Occupational History  . HOMEMAKER    Social History Main Topics  . Smoking  status: Never Smoker   . Smokeless tobacco: Not on file  . Alcohol Use: No  . Drug Use: No  . Sexual Activity: Yes    Birth Control/ Protection: Surgical   Other Topics Concern  . Not on file   Social History Narrative   Lives at home with husband.      ROS:  As stated in the HPI and negative for all other systems.  PHYSICAL EXAM BP 150/82 mmHg  Pulse 72  Ht 5\' 3"  (1.6 m)  Wt 173 lb 8 oz (78.699 kg)  BMI 30.74 kg/m2 GENERAL:  Well appearing HEENT:  Pupils equal round and reactive, fundi not visualized, oral mucosa unremarkable NECK:  No jugular venous distention, waveform within normal limits, carotid upstroke brisk and symmetric, no bruits, no thyromegaly LUNGS:  Clear to auscultation bilaterally BACK:  No CVA tenderness CHEST:  Unremarkable HEART:  PMI not displaced or sustained,S1 and S2 within normal limits, no S3, no S4, no clicks, no rubs, no murmurs ABD:  Flat, positive bowel sounds normal in frequency in pitch, no bruits, no rebound, no guarding, no midline pulsatile mass, no hepatomegaly, no splenomegaly EXT:  2 plus pulses throughout, no edema, no cyanosis no clubbing  EKG:  Sinus rhythm, rate 72, axis within normal limits, intervals within normal limits, no acute ST-T wave changes. PVCs 12/07/2014  ASSESSMENT AND PLAN   HTN:  Given the  sudden drop in her BP with lisinopril and her preference not to be on medications we're being very cautious about this. She's going to keep a blood pressure diary. She will increase her activity level and see if this controls her blood pressure.  PVCs:  I will check an echocardiogram. We spent quite a bit of time discussing this. She will start to wean down her caffeine to see if this helps. She does not want to try medications at this point.

## 2014-12-07 NOTE — Patient Instructions (Signed)
Your physician has requested that you have an echocardiogram. Echocardiography is a painless test that uses sound waves to create images of your heart. It provides your doctor with information about the size and shape of your heart and how well your heart's chambers and valves are working. This procedure takes approximately one hour. There are no restrictions for this procedure.  Your physician recommends that you schedule a follow-up appointment in: 3 months  

## 2014-12-18 ENCOUNTER — Ambulatory Visit (HOSPITAL_COMMUNITY)
Admission: RE | Admit: 2014-12-18 | Discharge: 2014-12-18 | Disposition: A | Discharge status: Home / Self Care | Payer: BLUE CROSS/BLUE SHIELD | Source: Ambulatory Visit | Attending: Cardiovascular Disease | Admitting: Cardiovascular Disease

## 2014-12-18 DIAGNOSIS — I493 Ventricular premature depolarization: Secondary | ICD-10-CM | POA: Diagnosis not present

## 2014-12-18 DIAGNOSIS — Z8249 Family history of ischemic heart disease and other diseases of the circulatory system: Secondary | ICD-10-CM | POA: Insufficient documentation

## 2014-12-18 NOTE — Progress Notes (Signed)
2D Echo Complete.  12/18/2014  [place] Apgar Wein[place], [place]

## 2014-12-21 NOTE — Progress Notes (Signed)
Pt. Informed about echo

## 2015-01-30 ENCOUNTER — Other Ambulatory Visit: Payer: Self-pay | Admitting: Gynecology

## 2015-02-06 ENCOUNTER — Encounter: Payer: Self-pay | Admitting: Cardiology

## 2015-03-05 ENCOUNTER — Ambulatory Visit: Payer: BLUE CROSS/BLUE SHIELD | Admitting: Cardiology

## 2015-03-15 ENCOUNTER — Ambulatory Visit
Admission: RE | Admit: 2015-03-15 | Discharge: 2015-03-15 | Disposition: A | Discharge status: Home / Self Care | Payer: BLUE CROSS/BLUE SHIELD | Source: Ambulatory Visit | Attending: Family Medicine | Admitting: Family Medicine

## 2015-03-15 ENCOUNTER — Other Ambulatory Visit: Payer: Self-pay | Admitting: Family Medicine

## 2015-03-15 DIAGNOSIS — R05 Cough: Secondary | ICD-10-CM

## 2015-03-15 DIAGNOSIS — R059 Cough, unspecified: Secondary | ICD-10-CM

## 2015-04-10 ENCOUNTER — Ambulatory Visit: Payer: BLUE CROSS/BLUE SHIELD | Admitting: Cardiology

## 2015-05-24 ENCOUNTER — Ambulatory Visit: Payer: BLUE CROSS/BLUE SHIELD | Admitting: Cardiology

## 2015-06-15 ENCOUNTER — Ambulatory Visit (INDEPENDENT_AMBULATORY_CARE_PROVIDER_SITE_OTHER): Payer: BLUE CROSS/BLUE SHIELD | Admitting: Cardiology

## 2015-06-15 ENCOUNTER — Encounter: Payer: Self-pay | Admitting: Cardiology

## 2015-06-15 VITALS — BP 136/80 | HR 70 | Ht 63.5 in | Wt 179.6 lb

## 2015-06-15 DIAGNOSIS — I10 Essential (primary) hypertension: Secondary | ICD-10-CM | POA: Diagnosis not present

## 2015-06-15 NOTE — Patient Instructions (Signed)
Your physician recommends that you schedule a follow-up appointment As Needed  

## 2015-06-15 NOTE — Progress Notes (Signed)
   HPI The patient presents for evaluation of PVCs. These were noted on EKG.  After the last appt I sent her for an echo which was unremarkable.  Since I last saw her she has done well.  The patient denies any new symptoms such as chest discomfort, neck or arm discomfort. There has been no new shortness of breath, PND or orthopnea. There have been no reported  presyncope or syncope.  She has rare palpitations she's not particularly problematic.  She did lose her sister and brother in law recently.    No Known Allergies  Current Outpatient Prescriptions  Medication Sig Dispense Refill  . Multiple Vitamin (MULTIVITAMIN) tablet Take 1 tablet by mouth daily.    Marland Kitchen PREMARIN 0.625 MG tablet take 1 tablet by mouth once daily 30 tablet 0  . triamterene-hydrochlorothiazide (MAXZIDE-25) 37.5-25 MG per tablet Take 1 tablet by mouth daily. 30 tablet 0   No current facility-administered medications for this visit.    Past Medical History  Diagnosis Date  . CIN I (cervical intraepithelial neoplasia I)   . Endometriosis   . Kidney stones   . Squamous cell carcinoma     Past Surgical History  Procedure Laterality Date  . Gynecologic cryosurgery    . Abdominal hysterectomy  2000    TAH  . Squamous cell excised      ROS:  As stated in the HPI and negative for all other systems.  PHYSICAL EXAM BP 136/80 mmHg  Pulse 70  Ht 5' 3.5" (1.613 m)  Wt 179 lb 9.6 oz (81.466 kg)  BMI 31.31 kg/m2 GENERAL:  Well appearing NECK:  No jugular venous distention, waveform within normal limits, carotid upstroke brisk and symmetric, no bruits, no thyromegaly LUNGS:  Clear to auscultation bilaterally CHEST:  Unremarkable HEART:  PMI not displaced or sustained,S1 and S2 within normal limits, no S3, no S4, no clicks, no rubs, no murmurs ABD:  Flat, positive bowel sounds normal in frequency in pitch, no bruits, no rebound, no guarding, no midline pulsatile mass, no hepatomegaly, no splenomegaly EXT:  2 plus pulses  throughout, no edema, no cyanosis no clubbing  EKG:  Sinus rhythm, rate 70, axis within normal limits, intervals within normal limits, no acute ST-T wave changes. PVCs 06/15/2015  ASSESSMENT AND PLAN   HTN:   The blood pressure is at target. No change in medications is indicated. We will continue with therapeutic lifestyle changes (TLC).  PVCs: These are not particularly problematic. No change in therapy is indicated.

## 2015-07-02 DIAGNOSIS — Z85828 Personal history of other malignant neoplasm of skin: Secondary | ICD-10-CM | POA: Diagnosis not present

## 2015-07-02 DIAGNOSIS — L821 Other seborrheic keratosis: Secondary | ICD-10-CM | POA: Diagnosis not present

## 2015-07-02 DIAGNOSIS — L570 Actinic keratosis: Secondary | ICD-10-CM | POA: Diagnosis not present

## 2015-07-02 DIAGNOSIS — B078 Other viral warts: Secondary | ICD-10-CM | POA: Diagnosis not present

## 2016-03-19 DIAGNOSIS — M79672 Pain in left foot: Secondary | ICD-10-CM | POA: Diagnosis not present

## 2016-04-11 DIAGNOSIS — I10 Essential (primary) hypertension: Secondary | ICD-10-CM | POA: Diagnosis not present

## 2016-04-11 DIAGNOSIS — N959 Unspecified menopausal and perimenopausal disorder: Secondary | ICD-10-CM | POA: Diagnosis not present

## 2016-04-11 DIAGNOSIS — R5383 Other fatigue: Secondary | ICD-10-CM | POA: Diagnosis not present

## 2016-04-11 DIAGNOSIS — Z136 Encounter for screening for cardiovascular disorders: Secondary | ICD-10-CM | POA: Diagnosis not present

## 2016-04-11 DIAGNOSIS — Z0000 Encounter for general adult medical examination without abnormal findings: Secondary | ICD-10-CM | POA: Diagnosis not present

## 2016-05-26 DIAGNOSIS — Z1231 Encounter for screening mammogram for malignant neoplasm of breast: Secondary | ICD-10-CM | POA: Diagnosis not present

## 2016-05-28 DIAGNOSIS — N6321 Unspecified lump in the left breast, upper outer quadrant: Secondary | ICD-10-CM | POA: Diagnosis not present

## 2016-05-28 DIAGNOSIS — N6002 Solitary cyst of left breast: Secondary | ICD-10-CM | POA: Diagnosis not present

## 2016-11-11 DIAGNOSIS — Z85828 Personal history of other malignant neoplasm of skin: Secondary | ICD-10-CM | POA: Diagnosis not present

## 2016-11-11 DIAGNOSIS — L570 Actinic keratosis: Secondary | ICD-10-CM | POA: Diagnosis not present

## 2016-11-11 DIAGNOSIS — C44722 Squamous cell carcinoma of skin of right lower limb, including hip: Secondary | ICD-10-CM | POA: Diagnosis not present

## 2016-11-11 DIAGNOSIS — L218 Other seborrheic dermatitis: Secondary | ICD-10-CM | POA: Diagnosis not present

## 2016-11-11 DIAGNOSIS — D485 Neoplasm of uncertain behavior of skin: Secondary | ICD-10-CM | POA: Diagnosis not present

## 2016-11-27 DIAGNOSIS — N6012 Diffuse cystic mastopathy of left breast: Secondary | ICD-10-CM | POA: Diagnosis not present

## 2016-12-25 DIAGNOSIS — Z0289 Encounter for other administrative examinations: Secondary | ICD-10-CM

## 2017-01-05 DIAGNOSIS — J309 Allergic rhinitis, unspecified: Secondary | ICD-10-CM | POA: Diagnosis not present

## 2017-04-22 DIAGNOSIS — N959 Unspecified menopausal and perimenopausal disorder: Secondary | ICD-10-CM | POA: Diagnosis not present

## 2017-04-22 DIAGNOSIS — I10 Essential (primary) hypertension: Secondary | ICD-10-CM | POA: Diagnosis not present

## 2017-04-27 DIAGNOSIS — C44612 Basal cell carcinoma of skin of right upper limb, including shoulder: Secondary | ICD-10-CM | POA: Diagnosis not present

## 2017-04-27 DIAGNOSIS — Z85828 Personal history of other malignant neoplasm of skin: Secondary | ICD-10-CM | POA: Diagnosis not present

## 2017-04-27 DIAGNOSIS — B078 Other viral warts: Secondary | ICD-10-CM | POA: Diagnosis not present

## 2017-04-27 DIAGNOSIS — L821 Other seborrheic keratosis: Secondary | ICD-10-CM | POA: Diagnosis not present

## 2017-04-27 DIAGNOSIS — D2271 Melanocytic nevi of right lower limb, including hip: Secondary | ICD-10-CM | POA: Diagnosis not present

## 2017-04-27 DIAGNOSIS — D485 Neoplasm of uncertain behavior of skin: Secondary | ICD-10-CM | POA: Diagnosis not present

## 2017-04-27 DIAGNOSIS — L570 Actinic keratosis: Secondary | ICD-10-CM | POA: Diagnosis not present

## 2017-04-27 DIAGNOSIS — L918 Other hypertrophic disorders of the skin: Secondary | ICD-10-CM | POA: Diagnosis not present

## 2017-05-14 DIAGNOSIS — M25562 Pain in left knee: Secondary | ICD-10-CM | POA: Diagnosis not present

## 2017-06-04 DIAGNOSIS — R921 Mammographic calcification found on diagnostic imaging of breast: Secondary | ICD-10-CM | POA: Diagnosis not present

## 2017-06-04 DIAGNOSIS — N6002 Solitary cyst of left breast: Secondary | ICD-10-CM | POA: Diagnosis not present

## 2017-06-04 DIAGNOSIS — R922 Inconclusive mammogram: Secondary | ICD-10-CM | POA: Diagnosis not present

## 2017-08-25 DIAGNOSIS — H2513 Age-related nuclear cataract, bilateral: Secondary | ICD-10-CM | POA: Diagnosis not present

## 2017-12-08 DIAGNOSIS — R922 Inconclusive mammogram: Secondary | ICD-10-CM | POA: Diagnosis not present

## 2017-12-08 DIAGNOSIS — R921 Mammographic calcification found on diagnostic imaging of breast: Secondary | ICD-10-CM | POA: Diagnosis not present

## 2018-01-21 DIAGNOSIS — L821 Other seborrheic keratosis: Secondary | ICD-10-CM | POA: Diagnosis not present

## 2018-01-21 DIAGNOSIS — Z85828 Personal history of other malignant neoplasm of skin: Secondary | ICD-10-CM | POA: Diagnosis not present

## 2018-01-21 DIAGNOSIS — L218 Other seborrheic dermatitis: Secondary | ICD-10-CM | POA: Diagnosis not present

## 2018-01-21 DIAGNOSIS — L570 Actinic keratosis: Secondary | ICD-10-CM | POA: Diagnosis not present

## 2018-01-21 DIAGNOSIS — L820 Inflamed seborrheic keratosis: Secondary | ICD-10-CM | POA: Diagnosis not present

## 2018-05-17 DIAGNOSIS — I10 Essential (primary) hypertension: Secondary | ICD-10-CM | POA: Diagnosis not present

## 2018-05-17 DIAGNOSIS — Z0000 Encounter for general adult medical examination without abnormal findings: Secondary | ICD-10-CM | POA: Diagnosis not present

## 2018-05-17 DIAGNOSIS — R000 Tachycardia, unspecified: Secondary | ICD-10-CM | POA: Diagnosis not present

## 2018-05-17 DIAGNOSIS — Z1159 Encounter for screening for other viral diseases: Secondary | ICD-10-CM | POA: Diagnosis not present

## 2018-05-17 DIAGNOSIS — E7800 Pure hypercholesterolemia, unspecified: Secondary | ICD-10-CM | POA: Diagnosis not present

## 2018-05-17 DIAGNOSIS — Z1331 Encounter for screening for depression: Secondary | ICD-10-CM | POA: Diagnosis not present

## 2018-05-17 DIAGNOSIS — Z1211 Encounter for screening for malignant neoplasm of colon: Secondary | ICD-10-CM | POA: Diagnosis not present

## 2018-05-17 DIAGNOSIS — N959 Unspecified menopausal and perimenopausal disorder: Secondary | ICD-10-CM | POA: Diagnosis not present

## 2018-06-02 DIAGNOSIS — D120 Benign neoplasm of cecum: Secondary | ICD-10-CM | POA: Diagnosis not present

## 2018-06-02 DIAGNOSIS — K6389 Other specified diseases of intestine: Secondary | ICD-10-CM | POA: Diagnosis not present

## 2018-06-02 DIAGNOSIS — R195 Other fecal abnormalities: Secondary | ICD-10-CM | POA: Diagnosis not present

## 2018-06-08 DIAGNOSIS — D120 Benign neoplasm of cecum: Secondary | ICD-10-CM | POA: Diagnosis not present

## 2018-06-29 DIAGNOSIS — Z780 Asymptomatic menopausal state: Secondary | ICD-10-CM | POA: Diagnosis not present

## 2019-03-07 DIAGNOSIS — Z1231 Encounter for screening mammogram for malignant neoplasm of breast: Secondary | ICD-10-CM | POA: Diagnosis not present

## 2019-03-08 ENCOUNTER — Ambulatory Visit (INDEPENDENT_AMBULATORY_CARE_PROVIDER_SITE_OTHER): Payer: Medicare Other | Admitting: Obstetrics & Gynecology

## 2019-03-08 ENCOUNTER — Other Ambulatory Visit: Payer: Self-pay

## 2019-03-08 ENCOUNTER — Encounter: Payer: Self-pay | Admitting: Obstetrics & Gynecology

## 2019-03-08 VITALS — BP 128/82 | Ht 62.5 in | Wt 152.6 lb

## 2019-03-08 DIAGNOSIS — N9089 Other specified noninflammatory disorders of vulva and perineum: Secondary | ICD-10-CM | POA: Diagnosis not present

## 2019-03-08 DIAGNOSIS — Z79890 Hormone replacement therapy (postmenopausal): Secondary | ICD-10-CM | POA: Diagnosis not present

## 2019-03-08 DIAGNOSIS — Z1272 Encounter for screening for malignant neoplasm of vagina: Secondary | ICD-10-CM | POA: Diagnosis not present

## 2019-03-08 DIAGNOSIS — Z01419 Encounter for gynecological examination (general) (routine) without abnormal findings: Secondary | ICD-10-CM

## 2019-03-08 DIAGNOSIS — R102 Pelvic and perineal pain: Secondary | ICD-10-CM

## 2019-03-08 LAB — WET PREP FOR TRICH, YEAST, CLUE

## 2019-03-08 MED ORDER — ESTRADIOL 0.5 MG PO TABS: 0.5000 mg | ORAL_TABLET | Freq: Every day | ORAL | 4 refills | Status: AC

## 2019-03-08 NOTE — Progress Notes (Signed)
Amanda Pollard 06-02-1951 [id number]   History:    68 y.o. G3P3L3 Married  RP:  New (5 yrs) patient presenting for annual gyn exam   HPI: S/P Total Hysterectomy.  C/O pelvic discomfort x about 2 weeks.  Vulvar itching and irritation.  No UTI Sx.  BMs normal.  Breasts normal.  Screening mammo done yesterday.  BMI 27.47.  Good fitness.  Health labs Fam MD.  Past medical history,surgical history, family history and social history were all reviewed and documented in the EPIC chart.  Gynecologic History No LMP recorded. Patient has had a hysterectomy. Contraception: status post hysterectomy Last Pap: 01/2014. Results were: Negative Last mammogram: 2020. Results were: normal per patient, will obtain report Bone Density: 2019 [facility], will obtain report Colonoscopy: 2019  Obstetric History OB History  Gravida Para Term Preterm AB Living  3 3 3     3   SAB TAB Ectopic Multiple Live Births               # Outcome Date GA Lbr Len/2nd Weight Sex Delivery Anes PTL Lv  3 Term           2 Term           1 Term              ROS: A ROS was performed and pertinent positives and negatives are included in the history.  GENERAL: No fevers or chills. HEENT: No change in vision, no earache, sore throat or sinus congestion. NECK: No pain or stiffness. CARDIOVASCULAR: No chest pain or pressure. No palpitations. PULMONARY: No shortness of breath, cough or wheeze. GASTROINTESTINAL: No abdominal pain, nausea, vomiting or diarrhea, melena or bright red blood per rectum. GENITOURINARY: No urinary frequency, urgency, hesitancy or dysuria. MUSCULOSKELETAL: No joint or muscle pain, no back pain, no recent trauma. DERMATOLOGIC: No rash, no itching, no lesions. ENDOCRINE: No polyuria, polydipsia, no heat or cold intolerance. No recent change in weight. HEMATOLOGICAL: No anemia or easy bruising or bleeding. NEUROLOGIC: No headache, seizures, numbness, tingling or weakness. PSYCHIATRIC: No depression, no loss of  interest in normal activity or change in sleep pattern.     Exam:   BP 128/82   Ht 5' 2.5" (1.588 m)   Wt 152 lb 9.6 oz (69.2 kg)   BMI 27.47 kg/m   Body mass index is 27.47 kg/m.  General appearance : Well developed well nourished female. No acute distress HEENT: Eyes: no retinal hemorrhage or exudates,  Neck supple, trachea midline, no carotid bruits, no thyroidmegaly Lungs: Clear to auscultation, no rhonchi or wheezes, or rib retractions  Heart: Regular rate and rhythm, no murmurs or gallops Breast:Examined in sitting and supine position were symmetrical in appearance, no palpable masses or tenderness,  no skin retraction, no nipple inversion, no nipple discharge, no skin discoloration, no axillary or supraclavicular lymphadenopathy Abdomen: no palpable masses or tenderness, no rebound or guarding Extremities: no edema or skin discoloration or tenderness  Pelvic: Vulva: Irritation with erythema             Vagina: No gross lesions.  Increased discharge.  Wet prep done.  Pap reflex done.  Cervix/Uterus absent  Adnexa  Without masses or tenderness  Anus: Normal  Wet prep:  Normal   Assessment/Plan:  68 y.o. female for annual exam   1. Encounter for Papanicolaou smear of vagina as part of routine gynecological examination Gynecologic exam status post total hysterectomy.  Pap reflex done on the vaginal vault.  Breast exam normal.  Screening mammogram normal per patient in 2020, will obtain report from [place].  Health labs with family physician.  Body mass index 27.47.  Recommend a slightly lower calorie/carb diet.  Continue with aerobic activities 5 times a week or more and weightlifting every 2 days.  2. Postmenopausal hormone replacement therapy Well on estradiol 0.5 mg 1 tablet daily.  Risks of breast cancer and blood clots/stroke at this point reviewed with patient.  Patient will start weaning hormone replacement therapy by taking half a tablet daily and further weaning from  there as tolerated.  Recommend vitamin D supplements, calcium intake of 1200 mg daily and continue regular weightbearing physical activities.  Will obtain [facility] density result done at [facility].  3. Pelvic pain in female Complains of pelvic pain for the last 2 weeks.  Status post total hysterectomy.  Ovaries were preserved.  Negative gynecologic exam.  Will complete investigation with a pelvic ultrasound at follow-up. - US Transvaginal Non-OB; Future  4. Vulvar irritation Vulvar inflammation probably associated with irritation from any water sports.  Wet prep negative.  Recommend changing bathing suit frequently and trying to stay dry as much as possible.  Could protect the vulva with Vaseline as needed.  Recommend over-the-counter hydroxy cortisone 1% cream for inflammation of the vulva. - WET PREP FOR [place], YEAST, CLUE  Other orders - estradiol (ESTRACE) 0.5 MG tablet; Take 1 tablet (0.5 mg total) by mouth daily.  Counseling on above issues and coordination of care more than 50% for 20 minutes.  Princess Bruins MD, 3:48 PM 03/08/2019

## 2019-03-09 ENCOUNTER — Encounter: Payer: Self-pay | Admitting: Obstetrics & Gynecology

## 2019-03-09 LAB — PAP IG W/ RFLX HPV ASCU

## 2019-03-09 NOTE — Patient Instructions (Signed)
1. Encounter for Papanicolaou smear of vagina as part of routine gynecological examination Gynecologic exam status post total hysterectomy.  Pap reflex done on the vaginal vault.  Breast exam normal.  Screening mammogram normal per patient in 2020, will obtain report from [place].  Health labs with family physician.  Body mass index 27.47.  Recommend a slightly lower calorie/carb diet.  Continue with aerobic activities 5 times a week or more and weightlifting every 2 days.  2. Postmenopausal hormone replacement therapy Well on estradiol 0.5 mg 1 tablet daily.  Risks of breast cancer and blood clots/stroke at this point reviewed with patient.  Patient will start weaning hormone replacement therapy by taking half a tablet daily and further weaning from there as tolerated.  Recommend vitamin D supplements, calcium intake of 1200 mg daily and continue regular weightbearing physical activities.  Will obtain bone density result done at [facility].  3. Pelvic pain in female Complains of pelvic pain for the last 2 weeks.  Status post total hysterectomy.  Ovaries were preserved.  Negative gynecologic exam.  Will complete investigation with a pelvic ultrasound at follow-up. - US Transvaginal Non-OB; Future  4. Vulvar irritation Vulvar inflammation probably associated with irritation from any water sports.  Wet prep negative.  Recommend changing bathing suit frequently and trying to stay dry as much as possible.  Could protect the vulva with Vaseline as needed.  Recommend over-the-counter hydroxy cortisone 1% cream for inflammation of the vulva. - WET PREP FOR [place], YEAST, CLUE  Other orders - estradiol (ESTRACE) 0.5 MG tablet; Take 1 tablet (0.5 mg total) by mouth daily.  Valeria, it was a pleasure meeting you today!  I will inform you of your results as soon as they are available.

## 2019-04-13 ENCOUNTER — Other Ambulatory Visit: Payer: Self-pay

## 2019-04-14 ENCOUNTER — Ambulatory Visit (INDEPENDENT_AMBULATORY_CARE_PROVIDER_SITE_OTHER): Payer: Medicare Other | Admitting: Obstetrics & Gynecology

## 2019-04-14 ENCOUNTER — Encounter: Payer: Self-pay | Admitting: Obstetrics & Gynecology

## 2019-04-14 ENCOUNTER — Ambulatory Visit (INDEPENDENT_AMBULATORY_CARE_PROVIDER_SITE_OTHER): Payer: Medicare Other

## 2019-04-14 VITALS — BP 142/90 | Ht 63.0 in | Wt 150.0 lb

## 2019-04-14 DIAGNOSIS — R102 Pelvic and perineal pain: Secondary | ICD-10-CM

## 2019-04-14 NOTE — Patient Instructions (Signed)
1. Pelvic pain in female Resolved pelvic pain.  Pelvic ultrasound findings reviewed with patient. Vaginal cuff is normal.  Right ovary normal.  Left ovary is small with a 7 mm avascular simple cystic structure.  No adnexal mass.  No free fluid in the posterior cul-de-sac.  Patient is reassured.  Follow-up for annual gynecologic exam next year.  Amanda Pollard, it was a pleasure seeing you today!

## 2019-04-14 NOTE — Progress Notes (Signed)
    Amanda Pollard 05-31-1951 [id number]        68 y.o.  G3P3003   RP: Pelvic Pain for Pelvic US  HPI: Pelvic pain has resolved spontaneously.  S/P Total Hysterectomy.   OB History  Gravida Para Term Preterm AB Living  3 3 3     3   SAB TAB Ectopic Multiple Live Births               # Outcome Date GA Lbr Len/2nd Weight Sex Delivery Anes PTL Lv  3 Term           2 Term           1 Term             Past medical history,surgical history, problem list, medications, allergies, family history and social history were all reviewed and documented in the EPIC chart.   Directed ROS with pertinent positives and negatives documented in the history of present illness/assessment and plan.  Exam:  Vitals:   04/14/19 1542  BP: (!) 142/90  Weight: 150 lb (68 kg)  Height: 5\' 3"  (1.6 m)   General appearance:  Normal  Pelvic US today: T/V images.  Uterus is surgically absent.  Vaginal cuff normal.  Right ovary small with atrophic appearance.  Left ovary is small with a 7 mm avascular simple cystic structure.  No adnexal mass.  No free fluid abdominally or vaginally.   Assessment/Plan:  68 y.o. G3P3003   1. Pelvic pain in female Resolved pelvic pain.  Pelvic ultrasound findings reviewed with patient. Vaginal cuff is normal.  Right ovary normal.  Left ovary is small with a 7 mm avascular simple cystic structure.  No adnexal mass.  No free fluid in the posterior cul-de-sac.  Patient is reassured.  Follow-up for annual gynecologic exam next year.  Counseling on above issues and coordination of care more than 50% for 15 minutes.  Princess Bruins MD, 3:50 PM 04/14/2019

## 2019-05-26 DIAGNOSIS — Z85828 Personal history of other malignant neoplasm of skin: Secondary | ICD-10-CM | POA: Diagnosis not present

## 2019-05-26 DIAGNOSIS — B078 Other viral warts: Secondary | ICD-10-CM | POA: Diagnosis not present

## 2019-05-26 DIAGNOSIS — L578 Other skin changes due to chronic exposure to nonionizing radiation: Secondary | ICD-10-CM | POA: Diagnosis not present

## 2019-05-26 DIAGNOSIS — L570 Actinic keratosis: Secondary | ICD-10-CM | POA: Diagnosis not present

## 2019-05-26 DIAGNOSIS — L821 Other seborrheic keratosis: Secondary | ICD-10-CM | POA: Diagnosis not present

## 2019-07-07 DIAGNOSIS — E7800 Pure hypercholesterolemia, unspecified: Secondary | ICD-10-CM | POA: Diagnosis not present

## 2019-07-07 DIAGNOSIS — Z0000 Encounter for general adult medical examination without abnormal findings: Secondary | ICD-10-CM | POA: Diagnosis not present

## 2019-07-07 DIAGNOSIS — N959 Unspecified menopausal and perimenopausal disorder: Secondary | ICD-10-CM | POA: Diagnosis not present

## 2019-07-07 DIAGNOSIS — I10 Essential (primary) hypertension: Secondary | ICD-10-CM | POA: Diagnosis not present

## 2019-10-11 DIAGNOSIS — Z85828 Personal history of other malignant neoplasm of skin: Secondary | ICD-10-CM | POA: Diagnosis not present

## 2019-10-11 DIAGNOSIS — L218 Other seborrheic dermatitis: Secondary | ICD-10-CM | POA: Diagnosis not present

## 2019-10-11 DIAGNOSIS — L720 Epidermal cyst: Secondary | ICD-10-CM | POA: Diagnosis not present

## 2020-03-08 DIAGNOSIS — Z1231 Encounter for screening mammogram for malignant neoplasm of breast: Secondary | ICD-10-CM | POA: Diagnosis not present

## 2020-03-29 ENCOUNTER — Other Ambulatory Visit: Payer: Self-pay | Admitting: Obstetrics & Gynecology

## 2020-03-29 ENCOUNTER — Encounter: Payer: Self-pay | Admitting: Physician Assistant

## 2020-03-29 DIAGNOSIS — U071 COVID-19: Secondary | ICD-10-CM | POA: Diagnosis not present

## 2020-03-30 ENCOUNTER — Other Ambulatory Visit: Payer: Self-pay | Admitting: Physician Assistant

## 2020-03-30 DIAGNOSIS — U071 COVID-19: Secondary | ICD-10-CM

## 2020-03-30 DIAGNOSIS — Z6827 Body mass index (BMI) 27.0-27.9, adult: Secondary | ICD-10-CM

## 2020-03-30 DIAGNOSIS — I10 Essential (primary) hypertension: Secondary | ICD-10-CM

## 2020-03-30 MED ORDER — SODIUM CHLORIDE 0.9 % IV SOLN
1200.0000 mg | Freq: Once | INTRAVENOUS | Status: AC
  Administered 2020-03-31: 1200 mg via INTRAVENOUS
  Filled 2020-03-30: qty 1200

## 2020-03-30 NOTE — Progress Notes (Addendum)
I connected by phone with Amanda Pollard on 03/30/2020 at 11:20 AM to discuss the potential use of a new treatment for mild to moderate COVID-19 viral infection in non-hospitalized patients.  This patient is a 69 y.o. female that meets the FDA criteria for Emergency Use Authorization of COVID monoclonal antibody casirivimab/imdevimab.  Has a (+) direct SARS-CoV-2 viral test result  Has mild or moderate COVID-19   Is NOT hospitalized due to COVID-19  Is within 10 days of symptom onset  Has at least one of the high risk factor(s) for progression to severe COVID-19 and/or hospitalization as defined in EUA.  Specific high risk criteria : Older age (>/= 70 yo), HTN, BMI>25   I have spoken and communicated the following to the patient or parent/caregiver regarding COVID monoclonal antibody treatment:  1. FDA has authorized the emergency use for the treatment of mild to moderate COVID-19 in adults and pediatric patients with positive results of direct SARS-CoV-2 viral testing who are 41 years of age and older weighing at least 40 kg, and who are at high risk for progressing to severe COVID-19 and/or hospitalization.  2. The significant known and potential risks and benefits of COVID monoclonal antibody, and the extent to which such potential risks and benefits are unknown.  3. Information on available alternative treatments and the risks and benefits of those alternatives, including clinical trials.  4. Patients treated with COVID monoclonal antibody should continue to self-isolate and use infection control measures (e.g., wear mask, isolate, social distance, avoid sharing personal items, clean and disinfect "high touch" surfaces, and frequent handwashing) according to CDC guidelines.   5. The patient or parent/caregiver has the option to accept or refuse COVID monoclonal antibody treatment.  After reviewing this information with the patient, The patient agreed to proceed with receiving  casirivimab\imdevimab infusion and will be provided a copy of the Fact sheet prior to receiving the infusion.  Sx onset 8/6. Set up for infusion on 8/14 @ 12:30pm. Directions given to [facility]. Pt is aware that insurance will be charged an infusion fee. Of note, patient has not been vaccinated.   [name] 03/30/2020 11:20 AM

## 2020-03-31 ENCOUNTER — Ambulatory Visit (HOSPITAL_COMMUNITY)
Admission: RE | Admit: 2020-03-31 | Discharge: 2020-03-31 | Disposition: A | Discharge status: Home / Self Care | Payer: Medicare Other | Source: Ambulatory Visit | Attending: Pulmonary Disease | Admitting: Pulmonary Disease

## 2020-03-31 DIAGNOSIS — Z6827 Body mass index (BMI) 27.0-27.9, adult: Secondary | ICD-10-CM

## 2020-03-31 DIAGNOSIS — U071 COVID-19: Secondary | ICD-10-CM | POA: Insufficient documentation

## 2020-03-31 DIAGNOSIS — I10 Essential (primary) hypertension: Secondary | ICD-10-CM | POA: Diagnosis not present

## 2020-03-31 DIAGNOSIS — Z23 Encounter for immunization: Secondary | ICD-10-CM | POA: Diagnosis not present

## 2020-03-31 MED ORDER — EPINEPHRINE 0.3 MG/0.3ML IJ SOAJ: 0.3000 mg | Freq: Once | INTRAMUSCULAR | Status: DC | PRN

## 2020-03-31 MED ORDER — METHYLPREDNISOLONE SODIUM SUCC 125 MG IJ SOLR: 125.0000 mg | Freq: Once | INTRAMUSCULAR | Status: DC | PRN

## 2020-03-31 MED ORDER — SODIUM CHLORIDE 0.9 % IV SOLN: INTRAVENOUS | Status: DC | PRN

## 2020-03-31 MED ORDER — DIPHENHYDRAMINE HCL 50 MG/ML IJ SOLN: 50.0000 mg | Freq: Once | INTRAMUSCULAR | Status: DC | PRN

## 2020-03-31 MED ORDER — FAMOTIDINE IN NACL 20-0.9 MG/50ML-% IV SOLN: 20.0000 mg | Freq: Once | INTRAVENOUS | Status: DC | PRN

## 2020-03-31 MED ORDER — ALBUTEROL SULFATE HFA 108 (90 BASE) MCG/ACT IN AERS: 2.0000 | INHALATION_SPRAY | Freq: Once | RESPIRATORY_TRACT | Status: DC | PRN

## 2020-03-31 NOTE — Discharge Instructions (Signed)

## 2020-03-31 NOTE — Progress Notes (Signed)
  Diagnosis: COVID-19  Physician: Dr. Joya Gaskins  Procedure: [facility] Med: remdesivir infusion - Provided patient with remdesivir fact sheet for patients, parents and caregivers prior to infusion.  Complications: No immediate complications noted.  Discharge: Discharged home   Amanda Pollard 03/31/2020

## 2020-04-11 DIAGNOSIS — R079 Chest pain, unspecified: Secondary | ICD-10-CM | POA: Diagnosis not present

## 2020-05-10 DIAGNOSIS — N6002 Solitary cyst of left breast: Secondary | ICD-10-CM | POA: Diagnosis not present

## 2020-05-10 DIAGNOSIS — R922 Inconclusive mammogram: Secondary | ICD-10-CM | POA: Diagnosis not present

## 2020-07-19 DIAGNOSIS — Z0000 Encounter for general adult medical examination without abnormal findings: Secondary | ICD-10-CM | POA: Diagnosis not present

## 2020-07-19 DIAGNOSIS — E7800 Pure hypercholesterolemia, unspecified: Secondary | ICD-10-CM | POA: Diagnosis not present

## 2020-07-19 DIAGNOSIS — I10 Essential (primary) hypertension: Secondary | ICD-10-CM | POA: Diagnosis not present

## 2020-07-19 DIAGNOSIS — N959 Unspecified menopausal and perimenopausal disorder: Secondary | ICD-10-CM | POA: Diagnosis not present

## 2020-07-19 DIAGNOSIS — R609 Edema, unspecified: Secondary | ICD-10-CM | POA: Diagnosis not present

## 2020-10-15 DIAGNOSIS — Z23 Encounter for immunization: Secondary | ICD-10-CM | POA: Diagnosis not present

## 2020-12-25 DIAGNOSIS — L98499 Non-pressure chronic ulcer of skin of other sites with unspecified severity: Secondary | ICD-10-CM | POA: Diagnosis not present

## 2020-12-25 DIAGNOSIS — C801 Malignant (primary) neoplasm, unspecified: Secondary | ICD-10-CM | POA: Diagnosis not present

## 2020-12-25 DIAGNOSIS — C4492 Squamous cell carcinoma of skin, unspecified: Secondary | ICD-10-CM | POA: Diagnosis not present

## 2020-12-25 DIAGNOSIS — L03116 Cellulitis of left lower limb: Secondary | ICD-10-CM | POA: Diagnosis not present

## 2021-01-01 DIAGNOSIS — C44729 Squamous cell carcinoma of skin of left lower limb, including hip: Secondary | ICD-10-CM | POA: Diagnosis not present

## 2021-01-01 DIAGNOSIS — D485 Neoplasm of uncertain behavior of skin: Secondary | ICD-10-CM | POA: Diagnosis not present

## 2021-01-01 DIAGNOSIS — Z85828 Personal history of other malignant neoplasm of skin: Secondary | ICD-10-CM | POA: Diagnosis not present

## 2021-01-15 DIAGNOSIS — S30860A Insect bite (nonvenomous) of lower back and pelvis, initial encounter: Secondary | ICD-10-CM | POA: Diagnosis not present

## 2021-01-15 DIAGNOSIS — L03115 Cellulitis of right lower limb: Secondary | ICD-10-CM | POA: Diagnosis not present

## 2021-01-15 DIAGNOSIS — W57XXXA Bitten or stung by nonvenomous insect and other nonvenomous arthropods, initial encounter: Secondary | ICD-10-CM | POA: Diagnosis not present

## 2021-01-17 DIAGNOSIS — R609 Edema, unspecified: Secondary | ICD-10-CM | POA: Diagnosis not present

## 2021-01-29 DIAGNOSIS — C44729 Squamous cell carcinoma of skin of left lower limb, including hip: Secondary | ICD-10-CM | POA: Diagnosis not present

## 2021-01-29 DIAGNOSIS — Z85828 Personal history of other malignant neoplasm of skin: Secondary | ICD-10-CM | POA: Diagnosis not present

## 2021-02-05 DIAGNOSIS — Z4802 Encounter for removal of sutures: Secondary | ICD-10-CM | POA: Diagnosis not present

## 2021-03-04 DIAGNOSIS — L0889 Other specified local infections of the skin and subcutaneous tissue: Secondary | ICD-10-CM | POA: Diagnosis not present

## 2021-03-14 DIAGNOSIS — Z1231 Encounter for screening mammogram for malignant neoplasm of breast: Secondary | ICD-10-CM | POA: Diagnosis not present

## 2021-03-26 DIAGNOSIS — L309 Dermatitis, unspecified: Secondary | ICD-10-CM | POA: Diagnosis not present

## 2021-03-26 DIAGNOSIS — H60391 Other infective otitis externa, right ear: Secondary | ICD-10-CM | POA: Diagnosis not present

## 2021-04-24 DIAGNOSIS — D0471 Carcinoma in situ of skin of right lower limb, including hip: Secondary | ICD-10-CM | POA: Diagnosis not present

## 2021-04-24 DIAGNOSIS — Z85828 Personal history of other malignant neoplasm of skin: Secondary | ICD-10-CM | POA: Diagnosis not present

## 2021-04-24 DIAGNOSIS — D485 Neoplasm of uncertain behavior of skin: Secondary | ICD-10-CM | POA: Diagnosis not present

## 2021-04-24 DIAGNOSIS — C44319 Basal cell carcinoma of skin of other parts of face: Secondary | ICD-10-CM | POA: Diagnosis not present

## 2021-04-24 DIAGNOSIS — C44722 Squamous cell carcinoma of skin of right lower limb, including hip: Secondary | ICD-10-CM | POA: Diagnosis not present

## 2021-04-24 DIAGNOSIS — L570 Actinic keratosis: Secondary | ICD-10-CM | POA: Diagnosis not present

## 2021-05-23 DIAGNOSIS — C44319 Basal cell carcinoma of skin of other parts of face: Secondary | ICD-10-CM | POA: Diagnosis not present

## 2021-05-23 DIAGNOSIS — Z85828 Personal history of other malignant neoplasm of skin: Secondary | ICD-10-CM | POA: Diagnosis not present

## 2021-05-29 DIAGNOSIS — Z4802 Encounter for removal of sutures: Secondary | ICD-10-CM | POA: Diagnosis not present

## 2021-06-27 DIAGNOSIS — D0461 Carcinoma in situ of skin of right upper limb, including shoulder: Secondary | ICD-10-CM | POA: Diagnosis not present

## 2021-06-27 DIAGNOSIS — Z85828 Personal history of other malignant neoplasm of skin: Secondary | ICD-10-CM | POA: Diagnosis not present

## 2021-06-27 DIAGNOSIS — D0462 Carcinoma in situ of skin of left upper limb, including shoulder: Secondary | ICD-10-CM | POA: Diagnosis not present

## 2021-06-27 DIAGNOSIS — D485 Neoplasm of uncertain behavior of skin: Secondary | ICD-10-CM | POA: Diagnosis not present

## 2021-06-27 DIAGNOSIS — L570 Actinic keratosis: Secondary | ICD-10-CM | POA: Diagnosis not present

## 2021-06-27 DIAGNOSIS — L821 Other seborrheic keratosis: Secondary | ICD-10-CM | POA: Diagnosis not present

## 2021-07-24 DIAGNOSIS — I10 Essential (primary) hypertension: Secondary | ICD-10-CM | POA: Diagnosis not present

## 2021-07-24 DIAGNOSIS — E7800 Pure hypercholesterolemia, unspecified: Secondary | ICD-10-CM | POA: Diagnosis not present

## 2021-07-29 DIAGNOSIS — E7800 Pure hypercholesterolemia, unspecified: Secondary | ICD-10-CM | POA: Diagnosis not present

## 2021-07-29 DIAGNOSIS — Z0000 Encounter for general adult medical examination without abnormal findings: Secondary | ICD-10-CM | POA: Diagnosis not present

## 2021-07-29 DIAGNOSIS — I10 Essential (primary) hypertension: Secondary | ICD-10-CM | POA: Diagnosis not present

## 2021-07-29 DIAGNOSIS — N959 Unspecified menopausal and perimenopausal disorder: Secondary | ICD-10-CM | POA: Diagnosis not present

## 2021-12-10 DIAGNOSIS — M9902 Segmental and somatic dysfunction of thoracic region: Secondary | ICD-10-CM | POA: Diagnosis not present

## 2021-12-10 DIAGNOSIS — M9903 Segmental and somatic dysfunction of lumbar region: Secondary | ICD-10-CM | POA: Diagnosis not present

## 2021-12-10 DIAGNOSIS — M5136 Other intervertebral disc degeneration, lumbar region: Secondary | ICD-10-CM | POA: Diagnosis not present

## 2021-12-10 DIAGNOSIS — M5417 Radiculopathy, lumbosacral region: Secondary | ICD-10-CM | POA: Diagnosis not present

## 2021-12-10 DIAGNOSIS — M9905 Segmental and somatic dysfunction of pelvic region: Secondary | ICD-10-CM | POA: Diagnosis not present

## 2021-12-11 DIAGNOSIS — M5136 Other intervertebral disc degeneration, lumbar region: Secondary | ICD-10-CM | POA: Diagnosis not present

## 2021-12-11 DIAGNOSIS — M9905 Segmental and somatic dysfunction of pelvic region: Secondary | ICD-10-CM | POA: Diagnosis not present

## 2021-12-11 DIAGNOSIS — M9902 Segmental and somatic dysfunction of thoracic region: Secondary | ICD-10-CM | POA: Diagnosis not present

## 2021-12-11 DIAGNOSIS — M9903 Segmental and somatic dysfunction of lumbar region: Secondary | ICD-10-CM | POA: Diagnosis not present

## 2021-12-11 DIAGNOSIS — M5417 Radiculopathy, lumbosacral region: Secondary | ICD-10-CM | POA: Diagnosis not present

## 2021-12-12 DIAGNOSIS — R030 Elevated blood-pressure reading, without diagnosis of hypertension: Secondary | ICD-10-CM | POA: Diagnosis not present

## 2021-12-12 DIAGNOSIS — M545 Low back pain, unspecified: Secondary | ICD-10-CM | POA: Diagnosis not present

## 2021-12-12 DIAGNOSIS — Z87442 Personal history of urinary calculi: Secondary | ICD-10-CM | POA: Diagnosis not present

## 2021-12-12 DIAGNOSIS — M5417 Radiculopathy, lumbosacral region: Secondary | ICD-10-CM | POA: Diagnosis not present

## 2021-12-12 DIAGNOSIS — M9902 Segmental and somatic dysfunction of thoracic region: Secondary | ICD-10-CM | POA: Diagnosis not present

## 2021-12-12 DIAGNOSIS — M9905 Segmental and somatic dysfunction of pelvic region: Secondary | ICD-10-CM | POA: Diagnosis not present

## 2021-12-12 DIAGNOSIS — M5136 Other intervertebral disc degeneration, lumbar region: Secondary | ICD-10-CM | POA: Diagnosis not present

## 2021-12-12 DIAGNOSIS — R8290 Unspecified abnormal findings in urine: Secondary | ICD-10-CM | POA: Diagnosis not present

## 2021-12-12 DIAGNOSIS — M9903 Segmental and somatic dysfunction of lumbar region: Secondary | ICD-10-CM | POA: Diagnosis not present

## 2021-12-13 ENCOUNTER — Other Ambulatory Visit: Payer: Self-pay | Admitting: Family Medicine

## 2021-12-13 DIAGNOSIS — N200 Calculus of kidney: Secondary | ICD-10-CM

## 2021-12-16 DIAGNOSIS — M9905 Segmental and somatic dysfunction of pelvic region: Secondary | ICD-10-CM | POA: Diagnosis not present

## 2021-12-16 DIAGNOSIS — M5417 Radiculopathy, lumbosacral region: Secondary | ICD-10-CM | POA: Diagnosis not present

## 2021-12-16 DIAGNOSIS — M9903 Segmental and somatic dysfunction of lumbar region: Secondary | ICD-10-CM | POA: Diagnosis not present

## 2021-12-16 DIAGNOSIS — M5136 Other intervertebral disc degeneration, lumbar region: Secondary | ICD-10-CM | POA: Diagnosis not present

## 2021-12-16 DIAGNOSIS — M9902 Segmental and somatic dysfunction of thoracic region: Secondary | ICD-10-CM | POA: Diagnosis not present

## 2021-12-17 ENCOUNTER — Ambulatory Visit
Admission: RE | Admit: 2021-12-17 | Discharge: 2021-12-17 | Disposition: A | Discharge status: Home / Self Care | Payer: Medicare Other | Source: Ambulatory Visit | Attending: Family Medicine | Admitting: Family Medicine

## 2021-12-17 DIAGNOSIS — R319 Hematuria, unspecified: Secondary | ICD-10-CM | POA: Diagnosis not present

## 2021-12-17 DIAGNOSIS — M5136 Other intervertebral disc degeneration, lumbar region: Secondary | ICD-10-CM | POA: Diagnosis not present

## 2021-12-17 DIAGNOSIS — M9905 Segmental and somatic dysfunction of pelvic region: Secondary | ICD-10-CM | POA: Diagnosis not present

## 2021-12-17 DIAGNOSIS — M9903 Segmental and somatic dysfunction of lumbar region: Secondary | ICD-10-CM | POA: Diagnosis not present

## 2021-12-17 DIAGNOSIS — N200 Calculus of kidney: Secondary | ICD-10-CM

## 2021-12-17 DIAGNOSIS — R109 Unspecified abdominal pain: Secondary | ICD-10-CM | POA: Diagnosis not present

## 2021-12-17 DIAGNOSIS — M5417 Radiculopathy, lumbosacral region: Secondary | ICD-10-CM | POA: Diagnosis not present

## 2021-12-17 DIAGNOSIS — M9902 Segmental and somatic dysfunction of thoracic region: Secondary | ICD-10-CM | POA: Diagnosis not present

## 2021-12-19 DIAGNOSIS — M5136 Other intervertebral disc degeneration, lumbar region: Secondary | ICD-10-CM | POA: Diagnosis not present

## 2021-12-19 DIAGNOSIS — M9902 Segmental and somatic dysfunction of thoracic region: Secondary | ICD-10-CM | POA: Diagnosis not present

## 2021-12-19 DIAGNOSIS — M9905 Segmental and somatic dysfunction of pelvic region: Secondary | ICD-10-CM | POA: Diagnosis not present

## 2021-12-19 DIAGNOSIS — M5417 Radiculopathy, lumbosacral region: Secondary | ICD-10-CM | POA: Diagnosis not present

## 2021-12-19 DIAGNOSIS — M9903 Segmental and somatic dysfunction of lumbar region: Secondary | ICD-10-CM | POA: Diagnosis not present

## 2021-12-23 DIAGNOSIS — M9905 Segmental and somatic dysfunction of pelvic region: Secondary | ICD-10-CM | POA: Diagnosis not present

## 2021-12-23 DIAGNOSIS — M5136 Other intervertebral disc degeneration, lumbar region: Secondary | ICD-10-CM | POA: Diagnosis not present

## 2021-12-23 DIAGNOSIS — M9902 Segmental and somatic dysfunction of thoracic region: Secondary | ICD-10-CM | POA: Diagnosis not present

## 2021-12-23 DIAGNOSIS — M9903 Segmental and somatic dysfunction of lumbar region: Secondary | ICD-10-CM | POA: Diagnosis not present

## 2021-12-23 DIAGNOSIS — M5417 Radiculopathy, lumbosacral region: Secondary | ICD-10-CM | POA: Diagnosis not present

## 2021-12-24 DIAGNOSIS — M9905 Segmental and somatic dysfunction of pelvic region: Secondary | ICD-10-CM | POA: Diagnosis not present

## 2021-12-24 DIAGNOSIS — M5417 Radiculopathy, lumbosacral region: Secondary | ICD-10-CM | POA: Diagnosis not present

## 2021-12-24 DIAGNOSIS — M9902 Segmental and somatic dysfunction of thoracic region: Secondary | ICD-10-CM | POA: Diagnosis not present

## 2021-12-24 DIAGNOSIS — M5136 Other intervertebral disc degeneration, lumbar region: Secondary | ICD-10-CM | POA: Diagnosis not present

## 2021-12-24 DIAGNOSIS — M9903 Segmental and somatic dysfunction of lumbar region: Secondary | ICD-10-CM | POA: Diagnosis not present

## 2021-12-31 DIAGNOSIS — M5417 Radiculopathy, lumbosacral region: Secondary | ICD-10-CM | POA: Diagnosis not present

## 2021-12-31 DIAGNOSIS — M9903 Segmental and somatic dysfunction of lumbar region: Secondary | ICD-10-CM | POA: Diagnosis not present

## 2021-12-31 DIAGNOSIS — M9905 Segmental and somatic dysfunction of pelvic region: Secondary | ICD-10-CM | POA: Diagnosis not present

## 2021-12-31 DIAGNOSIS — M9902 Segmental and somatic dysfunction of thoracic region: Secondary | ICD-10-CM | POA: Diagnosis not present

## 2021-12-31 DIAGNOSIS — M5136 Other intervertebral disc degeneration, lumbar region: Secondary | ICD-10-CM | POA: Diagnosis not present

## 2022-01-02 DIAGNOSIS — M9903 Segmental and somatic dysfunction of lumbar region: Secondary | ICD-10-CM | POA: Diagnosis not present

## 2022-01-02 DIAGNOSIS — M9902 Segmental and somatic dysfunction of thoracic region: Secondary | ICD-10-CM | POA: Diagnosis not present

## 2022-01-02 DIAGNOSIS — M5136 Other intervertebral disc degeneration, lumbar region: Secondary | ICD-10-CM | POA: Diagnosis not present

## 2022-01-02 DIAGNOSIS — M9905 Segmental and somatic dysfunction of pelvic region: Secondary | ICD-10-CM | POA: Diagnosis not present

## 2022-01-02 DIAGNOSIS — M5417 Radiculopathy, lumbosacral region: Secondary | ICD-10-CM | POA: Diagnosis not present

## 2022-01-09 DIAGNOSIS — M5136 Other intervertebral disc degeneration, lumbar region: Secondary | ICD-10-CM | POA: Diagnosis not present

## 2022-01-09 DIAGNOSIS — M9902 Segmental and somatic dysfunction of thoracic region: Secondary | ICD-10-CM | POA: Diagnosis not present

## 2022-01-09 DIAGNOSIS — M9905 Segmental and somatic dysfunction of pelvic region: Secondary | ICD-10-CM | POA: Diagnosis not present

## 2022-01-09 DIAGNOSIS — M5417 Radiculopathy, lumbosacral region: Secondary | ICD-10-CM | POA: Diagnosis not present

## 2022-01-09 DIAGNOSIS — M9903 Segmental and somatic dysfunction of lumbar region: Secondary | ICD-10-CM | POA: Diagnosis not present

## 2022-03-28 DIAGNOSIS — H1031 Unspecified acute conjunctivitis, right eye: Secondary | ICD-10-CM | POA: Diagnosis not present

## 2022-03-28 DIAGNOSIS — H00012 Hordeolum externum right lower eyelid: Secondary | ICD-10-CM | POA: Diagnosis not present

## 2022-03-31 DIAGNOSIS — H1045 Other chronic allergic conjunctivitis: Secondary | ICD-10-CM | POA: Diagnosis not present

## 2022-03-31 DIAGNOSIS — S0501XA Injury of conjunctiva and corneal abrasion without foreign body, right eye, initial encounter: Secondary | ICD-10-CM | POA: Diagnosis not present

## 2022-03-31 DIAGNOSIS — H168 Other keratitis: Secondary | ICD-10-CM | POA: Diagnosis not present

## 2022-04-03 DIAGNOSIS — H1045 Other chronic allergic conjunctivitis: Secondary | ICD-10-CM | POA: Diagnosis not present

## 2022-04-03 DIAGNOSIS — H168 Other keratitis: Secondary | ICD-10-CM | POA: Diagnosis not present

## 2022-04-03 DIAGNOSIS — S0501XA Injury of conjunctiva and corneal abrasion without foreign body, right eye, initial encounter: Secondary | ICD-10-CM | POA: Diagnosis not present

## 2022-04-07 DIAGNOSIS — S0501XA Injury of conjunctiva and corneal abrasion without foreign body, right eye, initial encounter: Secondary | ICD-10-CM | POA: Diagnosis not present

## 2022-04-07 DIAGNOSIS — H1045 Other chronic allergic conjunctivitis: Secondary | ICD-10-CM | POA: Diagnosis not present

## 2022-04-07 DIAGNOSIS — H168 Other keratitis: Secondary | ICD-10-CM | POA: Diagnosis not present

## 2022-04-14 DIAGNOSIS — S0501XA Injury of conjunctiva and corneal abrasion without foreign body, right eye, initial encounter: Secondary | ICD-10-CM | POA: Diagnosis not present

## 2022-04-14 DIAGNOSIS — H168 Other keratitis: Secondary | ICD-10-CM | POA: Diagnosis not present

## 2022-04-14 DIAGNOSIS — H1045 Other chronic allergic conjunctivitis: Secondary | ICD-10-CM | POA: Diagnosis not present

## 2022-04-25 DIAGNOSIS — Z1231 Encounter for screening mammogram for malignant neoplasm of breast: Secondary | ICD-10-CM | POA: Diagnosis not present

## 2022-05-15 DIAGNOSIS — L570 Actinic keratosis: Secondary | ICD-10-CM | POA: Diagnosis not present

## 2022-05-15 DIAGNOSIS — L218 Other seborrheic dermatitis: Secondary | ICD-10-CM | POA: Diagnosis not present

## 2022-05-15 DIAGNOSIS — L812 Freckles: Secondary | ICD-10-CM | POA: Diagnosis not present

## 2022-05-15 DIAGNOSIS — C44529 Squamous cell carcinoma of skin of other part of trunk: Secondary | ICD-10-CM | POA: Diagnosis not present

## 2022-05-15 DIAGNOSIS — Z85828 Personal history of other malignant neoplasm of skin: Secondary | ICD-10-CM | POA: Diagnosis not present

## 2022-05-15 DIAGNOSIS — L821 Other seborrheic keratosis: Secondary | ICD-10-CM | POA: Diagnosis not present

## 2022-05-15 DIAGNOSIS — D485 Neoplasm of uncertain behavior of skin: Secondary | ICD-10-CM | POA: Diagnosis not present

## 2022-05-15 DIAGNOSIS — C44321 Squamous cell carcinoma of skin of nose: Secondary | ICD-10-CM | POA: Diagnosis not present

## 2022-05-27 DIAGNOSIS — Z85828 Personal history of other malignant neoplasm of skin: Secondary | ICD-10-CM | POA: Diagnosis not present

## 2022-05-27 DIAGNOSIS — C44321 Squamous cell carcinoma of skin of nose: Secondary | ICD-10-CM | POA: Diagnosis not present

## 2022-05-30 NOTE — Progress Notes (Signed)
Histology and Location of Primary Skin Cancer:    Past/Anticipated interventions by patient's surgeon/dermatologist for current problematic lesion, if any:  05/27/2022 --Dr. Griselda Miner (office visit)   Past skin cancers, if any:   History of Blistering sunburns, if any: Significant history of sun exposure   SAFETY ISSUES: Prior radiation? No Pacemaker/ICD? No Possible current pregnancy? No--hysterectomy Is the patient on methotrexate? No  Current Complaints / other details:  Nothing else of note

## 2022-06-02 NOTE — Progress Notes (Signed)
Oncology Nurse Navigator Documentation   Placed introductory call to new referral patient Amanda Pollard.  Introduced myself as the H&N oncology nurse navigator that works with Dr. Isidore Moos to whom she has been referred by Dr. Sarajane Jews. She confirmed understanding of referral. Briefly explained my role as her navigator, provided my contact information.  Confirmed understanding of upcoming appts and [place] location, explained arrival and registration process. I encouraged her to call with questions/concerns as she moves forward with appts and procedures.   She verbalized understanding of information provided, expressed appreciation for my call.   Navigator Initial Assessment Employment Status: she is retired.  Currently on FMLA / STD: no Living Situation: she lives with her husband Support System: husband, family PCP: Dwyane Luo MD PCD: na Financial Concerns: no Transportation Needs: no Sensory Deficits: none Language Barriers/Interpreter Needed:  no Ambulation Needs: no DME Used in Home: no Psychosocial Needs:  no Concerns/Needs Understanding Cancer:  addressed/answered by navigator to best of ability Self-Expressed Needs: no   Harlow Asa RN, BSN, OCN Head & Neck Oncology Nurse [place] at [facility] Phone # [phone]  Fax # [phone]

## 2022-06-02 NOTE — Progress Notes (Signed)
Radiation Oncology         [phone] ________________________________  Initial Outpatient Consultation  Name: Amanda Pollard MRN: [id number]  Date: 06/03/2022  DOB: Feb 24, 1951  WU:JWJX, Darlen Round, MD  Mathews Robinsons, MD   REFERRING PHYSICIAN: Mathews Robinsons, MD  DIAGNOSIS:    ICD-10-CM   1. 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 female who presented to her dermatologist, Dr. Yetta Barre, this past September for evaluation of a lesion on her left nasal bridge.   Biopsy of the superior left nasal bridge lesion on 05/15/22 by Dr. Yetta Barre showed well differentiated squamous cell carcinoma with superficial infiltration. Biopsy of the left mid central chest also collected showed identical findings.   The patient was also seen by Dr. Irene Limbo on 05/23/22 for a second opinion prior to undergoing Mohs.   The patient has a significant history of sun exposure and a subsequent extensive history of skin cancer (detailed below).      History of Blistering sunburns, if any: Significant history of sun exposure   SAFETY ISSUES: Prior radiation? No Pacemaker/ICD? No Possible current pregnancy? No--hysterectomy Is the patient on methotrexate? No  Current Complaints / other details: She does not want to undergo Mohs surgery.  She had it previously in her  lower extremity and experienced postoperative infection.  She is very interested in radiation therapy as an alternative.  She reports that she has been applying fluorouracil to her face (recently completed regimen) and developed 2 new patches over the central nose below the biopsied lesion over the left upper nose   PREVIOUS RADIATION THERAPY: No  PAST MEDICAL HISTORY:  has a past medical history of CIN I (cervical intraepithelial neoplasia I), Endometriosis, HTN (hypertension), Kidney stones, and Squamous cell carcinoma.    PAST SURGICAL HISTORY: Past Surgical History:  Procedure Laterality Date   ABDOMINAL HYSTERECTOMY  2000   TAH   GYNECOLOGIC CRYOSURGERY     squamous cell excised      FAMILY HISTORY: family history includes Alzheimer's disease in her father; Cancer in her father; Diabetes in her mother; Heart disease in her maternal grandmother and paternal grandmother; Hypertension in her mother.  SOCIAL HISTORY:  reports that she has never smoked. She has been exposed to tobacco smoke. She has never used smokeless tobacco. She reports that she does not drink alcohol and does not use drugs.  ALLERGIES: Patient has no known allergies.  MEDICATIONS:  Current Outpatient Medications  Medication Sig Dispense Refill   acitretin (SORIATANE) 10 MG capsule Take 10 mg by mouth daily.     estradiol (ESTRACE) 0.5 MG tablet Take 1 tablet (0.5 mg total) by mouth daily. 90 tablet 4   fluorouracil (EFUDEX) 5 % cream Apply 1 Application topically at bedtime.     Multiple Vitamin (MULTIVITAMIN) tablet Take 1 tablet by mouth daily.  triamterene-hydrochlorothiazide (MAXZIDE-25) 37.5-25 MG per tablet Take 1 tablet by mouth daily. 30 tablet 0   valACYclovir (VALTREX) 500 MG tablet      valsartan (DIOVAN) 160 MG tablet Take 160 mg by mouth daily.     No current facility-administered medications for this encounter.    REVIEW OF SYSTEMS:  Notable for that above.   PHYSICAL EXAM:  height is 5\' 3"   (1.6 m) and weight is 181 lb 6 oz (82.3 kg). Her oral temperature is 97.8 F (36.6 C). Her blood pressure is 141/66 (abnormal) and her pulse is 66. Her respiration is 18 and oxygen saturation is 98%.   General: Alert and oriented, in no acute distress  HEENT: Head is normocephalic. Extraocular movements are intact.  She has erythematous patches over her face, please see photos below pertaining to her nose (area that was previously biopsied is circled in black and 2 new patches that developed with fluorouracil are circled in red) Neck: Neck is supple, no palpable periauricular, cervical or supraclavicular lymphadenopathy. Lymphatics: see Neck Exam Skin: As above Musculoskeletal: symmetric strength and muscle tone throughout.  Ambulatory. Neurologic: Cranial nerves II through XII are grossly intact. No obvious focalities. Speech is fluent. Coordination is intact. Psychiatric: Judgment and insight are intact. Affect is appropriate.    ECOG = 0  0 - Asymptomatic (Fully active, able to carry on all predisease activities without restriction)  1 - Symptomatic but completely ambulatory (Restricted in physically strenuous activity but ambulatory and able to carry out work of a light or sedentary nature. For example, light housework, office work)  2 - Symptomatic, <50% in bed during the day (Ambulatory and capable of all self care but unable to carry out any work activities. Up and about more than 50% of waking hours)  3 - Symptomatic, >50% in bed, but not bedbound (Capable of only limited self-care, confined to bed or chair 50% or more of waking hours)  4 - Bedbound (Completely disabled. Cannot carry on any self-care. Totally confined to bed or chair)  5 - Death   Santiago Glad MM, Creech RH, Tormey DC, et al. [phone]). "Toxicity and response criteria of the [facility] Group". Am. Evlyn Clines. Oncol. 5 (6): 649-55   LABORATORY DATA:  Lab Results  Component Value Date   WBC 11.2 (H)  12/16/2012   HGB 13.6 12/16/2012   HCT 40.6 12/16/2012   MCV 84.9 12/16/2012   PLT 347 12/16/2012   CMP     Component Value Date/Time   NA 138 12/16/2012 1622   K 3.9 12/16/2012 1622   CL 100 12/16/2012 1622   CO2 30 12/16/2012 1622   GLUCOSE 108 (H) 12/16/2012 1622   BUN 12 12/16/2012 1622   CREATININE 0.78 12/16/2012 1622   CALCIUM 9.9 12/16/2012 1622   PROT 7.0 12/16/2012 1622   ALBUMIN 4.0 12/16/2012 1622   AST 10 12/16/2012 1622   ALT 8 12/16/2012 1622   ALKPHOS 77 12/16/2012 1622   BILITOT 0.3 12/16/2012 1622   GFRNONAA >60 12/15/2010 2110   GFRAA  12/15/2010 2110    >60        The eGFR has been calculated using the MDRD equation. This calculation has not been validated in all clinical situations. eGFR's persistently <60 mL/min signify possible Chronic Kidney Disease.         RADIOGRAPHY: No results found.    IMPRESSION/PLAN: Squamous cell carcinoma of skin of nose   today, I talked to the patient about the findings and  work-up thus far.  We discussed the patient's diagnosis of skin cancer of the nose and general treatment for this, highlighting the role of radiotherapy in the management.  We discussed the available radiation techniques, and focused on the details of logistics and delivery.   She is intent on avoiding surgery if possible.  We talked about curative electron therapy to the nose as an alternative.  Radiation therapy would be given 5 days a week for 4 weeks.  I personally conferred with her dermatologists and Dr. Irene Limbo thinks it would be reasonable for me to treat the 2 new patches over her nose that are inferior to the biopsy-proven skin cancer over the left upper nose.    We discussed the risks, benefits, and side effects of radiotherapy. Side effects may include but not necessarily be limited to: Skin irritation, skin peeling, infection, nostril irritation, fatigue, hair loss in the irradiated field, rare permanent  injury to soft tissue, skin,  cartilage, bone. No guarantees of treatment were given. A consent form was signed and placed in the patient's medical record.  The patient was encouraged to ask questions that I answered to the best of my ability.  She is enthusiastic to proceed.  She does have a cruise scheduled from November 1 through 6.  We will work around that so that her treatment planning takes place before her trip and her treatment itself starts after her trip.    On date of service, in total, I spent 50 minutes on this encounter. Patient was seen in person.   __________________________________________   Lonie Peak, MD  This document serves as a record of services personally performed by Lonie Peak, MD. It was created on her behalf by Neena Rhymes, a trained medical scribe. The creation of this record is based on the scribe's personal observations and the provider's statements to them. This document has been checked and approved by the attending provider.

## 2022-06-03 ENCOUNTER — Encounter: Payer: Self-pay | Admitting: Radiation Oncology

## 2022-06-03 ENCOUNTER — Other Ambulatory Visit: Payer: Self-pay

## 2022-06-03 ENCOUNTER — Ambulatory Visit
Admission: RE | Admit: 2022-06-03 | Discharge: 2022-06-03 | Disposition: A | Discharge status: Home / Self Care | Payer: Medicare Other | Source: Ambulatory Visit | Attending: Radiation Oncology | Admitting: Radiation Oncology

## 2022-06-03 VITALS — BP 141/66 | HR 66 | Temp 97.8°F | Resp 18 | Ht 63.0 in | Wt 181.4 lb

## 2022-06-03 DIAGNOSIS — C44321 Squamous cell carcinoma of skin of nose: Secondary | ICD-10-CM | POA: Insufficient documentation

## 2022-06-03 DIAGNOSIS — Z85828 Personal history of other malignant neoplasm of skin: Secondary | ICD-10-CM | POA: Diagnosis not present

## 2022-06-03 DIAGNOSIS — I10 Essential (primary) hypertension: Secondary | ICD-10-CM | POA: Diagnosis not present

## 2022-06-03 DIAGNOSIS — N870 Mild cervical dysplasia: Secondary | ICD-10-CM | POA: Diagnosis not present

## 2022-06-03 DIAGNOSIS — Z87442 Personal history of urinary calculi: Secondary | ICD-10-CM | POA: Diagnosis not present

## 2022-06-03 DIAGNOSIS — Z79899 Other long term (current) drug therapy: Secondary | ICD-10-CM | POA: Diagnosis not present

## 2022-06-03 NOTE — Progress Notes (Signed)
Oncology Nurse Navigator Documentation   Met with patient during initial consult with Dr. Squire. Further introduced myself as her Navigator, explained my role as a member of the Care Team. Assisted with post-consult appt scheduling. She verbalized understanding of information provided. I encouraged her to call with questions/concerns moving forward.  Alleta Avery, RN, BSN, OCN Head & Neck Oncology Nurse Navigator [facility] at [place] [phone]  

## 2022-06-09 ENCOUNTER — Ambulatory Visit
Admission: RE | Admit: 2022-06-09 | Discharge: 2022-06-09 | Disposition: A | Discharge status: Home / Self Care | Payer: Medicare Other | Source: Ambulatory Visit | Attending: Radiation Oncology | Admitting: Radiation Oncology

## 2022-06-09 DIAGNOSIS — C44321 Squamous cell carcinoma of skin of nose: Secondary | ICD-10-CM | POA: Insufficient documentation

## 2022-06-11 DIAGNOSIS — C44321 Squamous cell carcinoma of skin of nose: Secondary | ICD-10-CM | POA: Diagnosis not present

## 2022-06-16 DIAGNOSIS — C44321 Squamous cell carcinoma of skin of nose: Secondary | ICD-10-CM | POA: Diagnosis not present

## 2022-06-25 ENCOUNTER — Ambulatory Visit: Payer: Medicare Other | Admitting: Radiation Oncology

## 2022-06-25 ENCOUNTER — Ambulatory Visit
Admission: RE | Admit: 2022-06-25 | Discharge: 2022-06-25 | Disposition: A | Discharge status: Home / Self Care | Payer: Medicare Other | Source: Ambulatory Visit | Attending: Radiation Oncology | Admitting: Radiation Oncology

## 2022-06-25 ENCOUNTER — Other Ambulatory Visit: Payer: Self-pay

## 2022-06-25 DIAGNOSIS — Z510 Encounter for antineoplastic radiation therapy: Secondary | ICD-10-CM | POA: Diagnosis not present

## 2022-06-25 DIAGNOSIS — C44321 Squamous cell carcinoma of skin of nose: Secondary | ICD-10-CM | POA: Diagnosis not present

## 2022-06-25 LAB — RAD ONC ARIA SESSION SUMMARY
Course Elapsed Days: 0
Plan Fractions Treated to Date: 1
Plan Prescribed Dose Per Fraction: 2.5 Gy
Plan Total Fractions Prescribed: 20
Plan Total Prescribed Dose: 50 Gy
Reference Point Dosage Given to Date: 2.5 Gy
Reference Point Session Dosage Given: 2.5 Gy
Session Number: 1

## 2022-06-26 ENCOUNTER — Ambulatory Visit
Admission: RE | Admit: 2022-06-26 | Discharge: 2022-06-26 | Disposition: A | Discharge status: Home / Self Care | Payer: Medicare Other | Source: Ambulatory Visit | Attending: Radiation Oncology | Admitting: Radiation Oncology

## 2022-06-26 ENCOUNTER — Other Ambulatory Visit: Payer: Self-pay

## 2022-06-26 DIAGNOSIS — C44321 Squamous cell carcinoma of skin of nose: Secondary | ICD-10-CM | POA: Diagnosis not present

## 2022-06-26 LAB — RAD ONC ARIA SESSION SUMMARY
Course Elapsed Days: 1
Plan Fractions Treated to Date: 2
Plan Prescribed Dose Per Fraction: 2.5 Gy
Plan Total Fractions Prescribed: 20
Plan Total Prescribed Dose: 50 Gy
Reference Point Dosage Given to Date: 5 Gy
Reference Point Session Dosage Given: 2.5 Gy
Session Number: 2

## 2022-06-27 ENCOUNTER — Ambulatory Visit
Admission: RE | Admit: 2022-06-27 | Discharge: 2022-06-27 | Disposition: A | Discharge status: Home / Self Care | Payer: Medicare Other | Source: Ambulatory Visit | Attending: Radiation Oncology | Admitting: Radiation Oncology

## 2022-06-27 ENCOUNTER — Other Ambulatory Visit: Payer: Self-pay

## 2022-06-27 DIAGNOSIS — C44321 Squamous cell carcinoma of skin of nose: Secondary | ICD-10-CM | POA: Diagnosis not present

## 2022-06-27 LAB — RAD ONC ARIA SESSION SUMMARY
Course Elapsed Days: 2
Plan Fractions Treated to Date: 3
Plan Prescribed Dose Per Fraction: 2.5 Gy
Plan Total Fractions Prescribed: 20
Plan Total Prescribed Dose: 50 Gy
Reference Point Dosage Given to Date: 7.5 Gy
Reference Point Session Dosage Given: 2.5 Gy
Session Number: 3

## 2022-06-30 ENCOUNTER — Other Ambulatory Visit: Payer: Self-pay

## 2022-06-30 ENCOUNTER — Ambulatory Visit: Payer: Medicare Other

## 2022-06-30 ENCOUNTER — Ambulatory Visit
Admission: RE | Admit: 2022-06-30 | Discharge: 2022-06-30 | Disposition: A | Discharge status: Home / Self Care | Payer: Medicare Other | Source: Ambulatory Visit | Attending: Radiation Oncology | Admitting: Radiation Oncology

## 2022-06-30 DIAGNOSIS — C44321 Squamous cell carcinoma of skin of nose: Secondary | ICD-10-CM

## 2022-06-30 LAB — RAD ONC ARIA SESSION SUMMARY
Course Elapsed Days: 5
Plan Fractions Treated to Date: 4
Plan Prescribed Dose Per Fraction: 2.5 Gy
Plan Total Fractions Prescribed: 20
Plan Total Prescribed Dose: 50 Gy
Reference Point Dosage Given to Date: 10 Gy
Reference Point Session Dosage Given: 2.5 Gy
Session Number: 4

## 2022-06-30 MED ORDER — SONAFINE EX EMUL: 1.0000 | Freq: Two times a day (BID) | CUTANEOUS | Status: DC

## 2022-07-01 ENCOUNTER — Ambulatory Visit
Admission: RE | Admit: 2022-07-01 | Discharge: 2022-07-01 | Disposition: A | Discharge status: Home / Self Care | Payer: Medicare Other | Source: Ambulatory Visit | Attending: Radiation Oncology | Admitting: Radiation Oncology

## 2022-07-01 ENCOUNTER — Other Ambulatory Visit: Payer: Self-pay

## 2022-07-01 DIAGNOSIS — C44321 Squamous cell carcinoma of skin of nose: Secondary | ICD-10-CM | POA: Diagnosis not present

## 2022-07-01 DIAGNOSIS — Z510 Encounter for antineoplastic radiation therapy: Secondary | ICD-10-CM | POA: Diagnosis not present

## 2022-07-01 LAB — RAD ONC ARIA SESSION SUMMARY
Course Elapsed Days: 6
Plan Fractions Treated to Date: 5
Plan Prescribed Dose Per Fraction: 2.5 Gy
Plan Total Fractions Prescribed: 20
Plan Total Prescribed Dose: 50 Gy
Reference Point Dosage Given to Date: 12.5 Gy
Reference Point Session Dosage Given: 2.5 Gy
Session Number: 5

## 2022-07-02 ENCOUNTER — Other Ambulatory Visit: Payer: Self-pay

## 2022-07-02 ENCOUNTER — Ambulatory Visit: Payer: Medicare Other

## 2022-07-02 ENCOUNTER — Ambulatory Visit
Admission: RE | Admit: 2022-07-02 | Discharge: 2022-07-02 | Disposition: A | Discharge status: Home / Self Care | Payer: Medicare Other | Source: Ambulatory Visit | Attending: Radiation Oncology | Admitting: Radiation Oncology

## 2022-07-02 DIAGNOSIS — C44321 Squamous cell carcinoma of skin of nose: Secondary | ICD-10-CM | POA: Diagnosis not present

## 2022-07-02 LAB — RAD ONC ARIA SESSION SUMMARY
Course Elapsed Days: 7
Plan Fractions Treated to Date: 6
Plan Prescribed Dose Per Fraction: 2.5 Gy
Plan Total Fractions Prescribed: 20
Plan Total Prescribed Dose: 50 Gy
Reference Point Dosage Given to Date: 15 Gy
Reference Point Session Dosage Given: 2.5 Gy
Session Number: 6

## 2022-07-03 ENCOUNTER — Ambulatory Visit
Admission: RE | Admit: 2022-07-03 | Discharge: 2022-07-03 | Disposition: A | Discharge status: Home / Self Care | Payer: Medicare Other | Source: Ambulatory Visit | Attending: Radiation Oncology | Admitting: Radiation Oncology

## 2022-07-03 ENCOUNTER — Other Ambulatory Visit: Payer: Self-pay

## 2022-07-03 DIAGNOSIS — C44321 Squamous cell carcinoma of skin of nose: Secondary | ICD-10-CM | POA: Diagnosis not present

## 2022-07-03 LAB — RAD ONC ARIA SESSION SUMMARY
Course Elapsed Days: 8
Plan Fractions Treated to Date: 7
Plan Prescribed Dose Per Fraction: 2.5 Gy
Plan Total Fractions Prescribed: 20
Plan Total Prescribed Dose: 50 Gy
Reference Point Dosage Given to Date: 17.5 Gy
Reference Point Session Dosage Given: 2.5 Gy
Session Number: 7

## 2022-07-04 ENCOUNTER — Other Ambulatory Visit: Payer: Self-pay

## 2022-07-04 ENCOUNTER — Ambulatory Visit
Admission: RE | Admit: 2022-07-04 | Discharge: 2022-07-04 | Disposition: A | Discharge status: Home / Self Care | Payer: Medicare Other | Source: Ambulatory Visit | Attending: Radiation Oncology | Admitting: Radiation Oncology

## 2022-07-04 DIAGNOSIS — C44321 Squamous cell carcinoma of skin of nose: Secondary | ICD-10-CM | POA: Diagnosis not present

## 2022-07-04 LAB — RAD ONC ARIA SESSION SUMMARY
Course Elapsed Days: 9
Plan Fractions Treated to Date: 8
Plan Prescribed Dose Per Fraction: 2.5 Gy
Plan Total Fractions Prescribed: 20
Plan Total Prescribed Dose: 50 Gy
Reference Point Dosage Given to Date: 20 Gy
Reference Point Session Dosage Given: 2.5 Gy
Session Number: 8

## 2022-07-06 ENCOUNTER — Other Ambulatory Visit: Payer: Self-pay

## 2022-07-06 ENCOUNTER — Ambulatory Visit
Admission: RE | Admit: 2022-07-06 | Discharge: 2022-07-06 | Disposition: A | Discharge status: Home / Self Care | Payer: Medicare Other | Source: Ambulatory Visit | Attending: Radiation Oncology | Admitting: Radiation Oncology

## 2022-07-06 DIAGNOSIS — C44321 Squamous cell carcinoma of skin of nose: Secondary | ICD-10-CM | POA: Diagnosis not present

## 2022-07-06 LAB — RAD ONC ARIA SESSION SUMMARY
Course Elapsed Days: 11
Plan Fractions Treated to Date: 9
Plan Prescribed Dose Per Fraction: 2.5 Gy
Plan Total Fractions Prescribed: 20
Plan Total Prescribed Dose: 50 Gy
Reference Point Dosage Given to Date: 22.5 Gy
Reference Point Session Dosage Given: 2.5 Gy
Session Number: 9

## 2022-07-07 ENCOUNTER — Ambulatory Visit
Admission: RE | Admit: 2022-07-07 | Discharge: 2022-07-07 | Disposition: A | Discharge status: Home / Self Care | Payer: Medicare Other | Source: Ambulatory Visit | Attending: Radiation Oncology | Admitting: Radiation Oncology

## 2022-07-07 ENCOUNTER — Ambulatory Visit: Payer: Medicare Other

## 2022-07-07 ENCOUNTER — Other Ambulatory Visit: Payer: Self-pay

## 2022-07-07 DIAGNOSIS — Z510 Encounter for antineoplastic radiation therapy: Secondary | ICD-10-CM | POA: Diagnosis not present

## 2022-07-07 DIAGNOSIS — C44321 Squamous cell carcinoma of skin of nose: Secondary | ICD-10-CM | POA: Diagnosis not present

## 2022-07-07 LAB — RAD ONC ARIA SESSION SUMMARY
Course Elapsed Days: 12
Plan Fractions Treated to Date: 10
Plan Prescribed Dose Per Fraction: 2.5 Gy
Plan Total Fractions Prescribed: 20
Plan Total Prescribed Dose: 50 Gy
Reference Point Dosage Given to Date: 25 Gy
Reference Point Session Dosage Given: 2.5 Gy
Session Number: 10

## 2022-07-08 ENCOUNTER — Other Ambulatory Visit: Payer: Self-pay

## 2022-07-08 ENCOUNTER — Ambulatory Visit
Admission: RE | Admit: 2022-07-08 | Discharge: 2022-07-08 | Disposition: A | Discharge status: Home / Self Care | Payer: Medicare Other | Source: Ambulatory Visit | Attending: Radiation Oncology | Admitting: Radiation Oncology

## 2022-07-08 DIAGNOSIS — C44321 Squamous cell carcinoma of skin of nose: Secondary | ICD-10-CM | POA: Diagnosis not present

## 2022-07-08 LAB — RAD ONC ARIA SESSION SUMMARY
Course Elapsed Days: 13
Plan Fractions Treated to Date: 11
Plan Prescribed Dose Per Fraction: 2.5 Gy
Plan Total Fractions Prescribed: 20
Plan Total Prescribed Dose: 50 Gy
Reference Point Dosage Given to Date: 27.5 Gy
Reference Point Session Dosage Given: 2.5 Gy
Session Number: 11

## 2022-07-09 ENCOUNTER — Ambulatory Visit
Admission: RE | Admit: 2022-07-09 | Discharge: 2022-07-09 | Disposition: A | Discharge status: Home / Self Care | Payer: Medicare Other | Source: Ambulatory Visit | Attending: Radiation Oncology | Admitting: Radiation Oncology

## 2022-07-09 ENCOUNTER — Other Ambulatory Visit: Payer: Self-pay

## 2022-07-09 DIAGNOSIS — C44321 Squamous cell carcinoma of skin of nose: Secondary | ICD-10-CM | POA: Diagnosis not present

## 2022-07-09 LAB — RAD ONC ARIA SESSION SUMMARY
Course Elapsed Days: 14
Plan Fractions Treated to Date: 12
Plan Prescribed Dose Per Fraction: 2.5 Gy
Plan Total Fractions Prescribed: 20
Plan Total Prescribed Dose: 50 Gy
Reference Point Dosage Given to Date: 30 Gy
Reference Point Session Dosage Given: 2.5 Gy
Session Number: 12

## 2022-07-14 ENCOUNTER — Ambulatory Visit
Admission: RE | Admit: 2022-07-14 | Discharge: 2022-07-14 | Disposition: A | Discharge status: Home / Self Care | Payer: Medicare Other | Source: Ambulatory Visit | Attending: Radiation Oncology | Admitting: Radiation Oncology

## 2022-07-14 ENCOUNTER — Other Ambulatory Visit: Payer: Self-pay | Admitting: Radiation Oncology

## 2022-07-14 ENCOUNTER — Other Ambulatory Visit: Payer: Self-pay

## 2022-07-14 DIAGNOSIS — C44321 Squamous cell carcinoma of skin of nose: Secondary | ICD-10-CM | POA: Diagnosis not present

## 2022-07-14 LAB — RAD ONC ARIA SESSION SUMMARY
Course Elapsed Days: 19
Plan Fractions Treated to Date: 13
Plan Prescribed Dose Per Fraction: 2.5 Gy
Plan Total Fractions Prescribed: 20
Plan Total Prescribed Dose: 50 Gy
Reference Point Dosage Given to Date: 32.5 Gy
Reference Point Session Dosage Given: 2.5 Gy
Session Number: 13

## 2022-07-14 MED ORDER — LIDOCAINE HCL 4 % EX GEL: CUTANEOUS | 1 refills | Status: AC

## 2022-07-15 ENCOUNTER — Other Ambulatory Visit: Payer: Self-pay

## 2022-07-15 ENCOUNTER — Ambulatory Visit
Admission: RE | Admit: 2022-07-15 | Discharge: 2022-07-15 | Disposition: A | Discharge status: Home / Self Care | Payer: Medicare Other | Source: Ambulatory Visit | Attending: Radiation Oncology | Admitting: Radiation Oncology

## 2022-07-15 DIAGNOSIS — C44321 Squamous cell carcinoma of skin of nose: Secondary | ICD-10-CM | POA: Diagnosis not present

## 2022-07-15 LAB — RAD ONC ARIA SESSION SUMMARY
Course Elapsed Days: 20
Plan Fractions Treated to Date: 14
Plan Prescribed Dose Per Fraction: 2.5 Gy
Plan Total Fractions Prescribed: 20
Plan Total Prescribed Dose: 50 Gy
Reference Point Dosage Given to Date: 35 Gy
Reference Point Session Dosage Given: 2.5 Gy
Session Number: 14

## 2022-07-16 ENCOUNTER — Ambulatory Visit
Admission: RE | Admit: 2022-07-16 | Discharge: 2022-07-16 | Disposition: A | Discharge status: Home / Self Care | Payer: Medicare Other | Source: Ambulatory Visit | Attending: Radiation Oncology | Admitting: Radiation Oncology

## 2022-07-16 ENCOUNTER — Other Ambulatory Visit: Payer: Self-pay

## 2022-07-16 DIAGNOSIS — Z510 Encounter for antineoplastic radiation therapy: Secondary | ICD-10-CM | POA: Diagnosis not present

## 2022-07-16 DIAGNOSIS — C44321 Squamous cell carcinoma of skin of nose: Secondary | ICD-10-CM | POA: Diagnosis not present

## 2022-07-16 LAB — RAD ONC ARIA SESSION SUMMARY
Course Elapsed Days: 21
Plan Fractions Treated to Date: 15
Plan Prescribed Dose Per Fraction: 2.5 Gy
Plan Total Fractions Prescribed: 20
Plan Total Prescribed Dose: 50 Gy
Reference Point Dosage Given to Date: 37.5 Gy
Reference Point Session Dosage Given: 2.5 Gy
Session Number: 15

## 2022-07-17 ENCOUNTER — Ambulatory Visit
Admission: RE | Admit: 2022-07-17 | Discharge: 2022-07-17 | Disposition: A | Discharge status: Home / Self Care | Payer: Medicare Other | Source: Ambulatory Visit | Attending: Radiation Oncology | Admitting: Radiation Oncology

## 2022-07-17 ENCOUNTER — Other Ambulatory Visit: Payer: Self-pay

## 2022-07-17 DIAGNOSIS — C44321 Squamous cell carcinoma of skin of nose: Secondary | ICD-10-CM | POA: Diagnosis not present

## 2022-07-17 LAB — RAD ONC ARIA SESSION SUMMARY
Course Elapsed Days: 22
Plan Fractions Treated to Date: 16
Plan Prescribed Dose Per Fraction: 2.5 Gy
Plan Total Fractions Prescribed: 20
Plan Total Prescribed Dose: 50 Gy
Reference Point Dosage Given to Date: 40 Gy
Reference Point Session Dosage Given: 2.5 Gy
Session Number: 16

## 2022-07-18 ENCOUNTER — Ambulatory Visit
Admission: RE | Admit: 2022-07-18 | Discharge: 2022-07-18 | Disposition: A | Discharge status: Home / Self Care | Payer: Medicare Other | Source: Ambulatory Visit | Attending: Radiation Oncology | Admitting: Radiation Oncology

## 2022-07-18 ENCOUNTER — Other Ambulatory Visit: Payer: Self-pay

## 2022-07-18 DIAGNOSIS — C44321 Squamous cell carcinoma of skin of nose: Secondary | ICD-10-CM | POA: Diagnosis not present

## 2022-07-18 LAB — RAD ONC ARIA SESSION SUMMARY
Course Elapsed Days: 23
Plan Fractions Treated to Date: 17
Plan Prescribed Dose Per Fraction: 2.5 Gy
Plan Total Fractions Prescribed: 20
Plan Total Prescribed Dose: 50 Gy
Reference Point Dosage Given to Date: 42.5 Gy
Reference Point Session Dosage Given: 2.5 Gy
Session Number: 17

## 2022-07-21 ENCOUNTER — Other Ambulatory Visit: Payer: Self-pay

## 2022-07-21 ENCOUNTER — Ambulatory Visit
Admission: RE | Admit: 2022-07-21 | Discharge: 2022-07-21 | Disposition: A | Discharge status: Home / Self Care | Payer: Medicare Other | Source: Ambulatory Visit | Attending: Radiation Oncology | Admitting: Radiation Oncology

## 2022-07-21 DIAGNOSIS — C44321 Squamous cell carcinoma of skin of nose: Secondary | ICD-10-CM | POA: Diagnosis not present

## 2022-07-21 LAB — RAD ONC ARIA SESSION SUMMARY
Course Elapsed Days: 26
Plan Fractions Treated to Date: 18
Plan Prescribed Dose Per Fraction: 2.5 Gy
Plan Total Fractions Prescribed: 20
Plan Total Prescribed Dose: 50 Gy
Reference Point Dosage Given to Date: 45 Gy
Reference Point Session Dosage Given: 2.5 Gy
Session Number: 18

## 2022-07-22 ENCOUNTER — Ambulatory Visit
Admission: RE | Admit: 2022-07-22 | Discharge: 2022-07-22 | Disposition: A | Discharge status: Home / Self Care | Payer: Medicare Other | Source: Ambulatory Visit | Attending: Radiation Oncology | Admitting: Radiation Oncology

## 2022-07-22 ENCOUNTER — Other Ambulatory Visit: Payer: Self-pay

## 2022-07-22 DIAGNOSIS — C44321 Squamous cell carcinoma of skin of nose: Secondary | ICD-10-CM | POA: Diagnosis not present

## 2022-07-22 LAB — RAD ONC ARIA SESSION SUMMARY
Course Elapsed Days: 27
Plan Fractions Treated to Date: 19
Plan Prescribed Dose Per Fraction: 2.5 Gy
Plan Total Fractions Prescribed: 20
Plan Total Prescribed Dose: 50 Gy
Reference Point Dosage Given to Date: 47.5 Gy
Reference Point Session Dosage Given: 2.5 Gy
Session Number: 19

## 2022-07-23 ENCOUNTER — Other Ambulatory Visit: Payer: Self-pay

## 2022-07-23 ENCOUNTER — Ambulatory Visit
Admission: RE | Admit: 2022-07-23 | Discharge: 2022-07-23 | Disposition: A | Discharge status: Home / Self Care | Payer: Medicare Other | Source: Ambulatory Visit | Attending: Radiation Oncology | Admitting: Radiation Oncology

## 2022-07-23 ENCOUNTER — Ambulatory Visit: Payer: Medicare Other

## 2022-07-23 DIAGNOSIS — Z510 Encounter for antineoplastic radiation therapy: Secondary | ICD-10-CM | POA: Diagnosis not present

## 2022-07-23 DIAGNOSIS — C44321 Squamous cell carcinoma of skin of nose: Secondary | ICD-10-CM | POA: Diagnosis not present

## 2022-07-23 LAB — RAD ONC ARIA SESSION SUMMARY
Course Elapsed Days: 28
Plan Fractions Treated to Date: 20
Plan Prescribed Dose Per Fraction: 2.5 Gy
Plan Total Fractions Prescribed: 20
Plan Total Prescribed Dose: 50 Gy
Reference Point Dosage Given to Date: 50 Gy
Reference Point Session Dosage Given: 2.5 Gy
Session Number: 20

## 2022-07-23 NOTE — Progress Notes (Signed)
Oncology Nurse Navigator Documentation   Met with Ms. Amanda Pollard after final RT to offer support and to celebrate end of radiation treatment.   Provided verbal post-RT guidance: Importance of keeping all follow-up appts. Importance of protecting treatment area from sun. Continuation of Sonafine application 2-3 times daily, application of antibiotic ointment to areas of raw skin; when supply of Sonafine exhausted transition to OTC lotion with vitamin E.  Explained my role as navigator will continue for several more months, encouraged her to call me with needs/concerns.    Harlow Asa RN, BSN, OCN Head & Neck Oncology Nurse [place] at [facility] Phone # [phone]  Fax # [phone]

## 2022-07-24 ENCOUNTER — Ambulatory Visit: Payer: Medicare Other

## 2022-07-28 DIAGNOSIS — I10 Essential (primary) hypertension: Secondary | ICD-10-CM | POA: Diagnosis not present

## 2022-07-28 DIAGNOSIS — E7800 Pure hypercholesterolemia, unspecified: Secondary | ICD-10-CM | POA: Diagnosis not present

## 2022-07-29 ENCOUNTER — Telehealth: Payer: Self-pay

## 2022-07-29 NOTE — Telephone Encounter (Signed)
Rn called silvadene cream Rx into pharmacy with no complications.

## 2022-07-29 NOTE — Telephone Encounter (Signed)
   Pt called RN concerning condition of her nose after completing radiation. Pt stated the area burned was increasingly getting redder (and more irritated). Dr. Isidore Moos viewed images of pt nose and Silvadene cream was called in for pt to apply to nose twice a day. Rn called pt with instructions on silvadene and washing area with just water with a soft washcloth. Pt had no questions on instructions. Pt stated she would call back with any concerns.

## 2022-07-31 ENCOUNTER — Telehealth: Payer: Self-pay

## 2022-07-31 NOTE — Telephone Encounter (Signed)
   Pt called and sent updated picture of her nose due to concerns for new scabbing in the area. She states overall the pain is much better and the area is less red but now has new scabbing on nose. She has been using Silvadene cream as prescribed. She also reports that she had nose bleeds two weeks ago for about two days (these are now gone). She does still report red blood coming out when she blows her nose. She states she feels like she has going something up her nose all the time. Pt has been putting neosporin up in her nose as well. She does state however all these s/s have been improving and are better overall. Rn instructed pt to send another picture tomorrow and that Rn will have Dr. Isidore Moos look at everything overall to see if she needs to be seen in the office.

## 2022-08-01 ENCOUNTER — Telehealth: Payer: Self-pay

## 2022-08-01 NOTE — Telephone Encounter (Signed)
Rn called to update pt on Dr. Lanell Persons feedback on nose healing and nose bleeding. Rn informed pt that per Dr. Isidore Moos her nose was healing well and that the nose bleeds were from irritation from radiation. Both of these areas will improve with time. She understood this and will send an updated pic to RN on Monday.

## 2022-08-01 NOTE — Telephone Encounter (Signed)
Mr. Trent sent message to Geoffry Paradise RN with updated pics. She also reports that her that she has no nose bleed except for when blowing nose. She states the nose seems more moist today. RN will discuss this concern with concern with MD and follow up with pt.

## 2022-08-04 ENCOUNTER — Telehealth: Payer: Self-pay

## 2022-08-04 NOTE — Telephone Encounter (Signed)
   Ms. Kollman sent pic to Geoffry Paradise RN of her nose today. It looks much better and she states it has greatly improved. Dr. Isidore Moos is pleased with her progress and does want her to continue to use Silvadene for about another week.  Rn will call pt to let her know.

## 2022-08-07 DIAGNOSIS — E669 Obesity, unspecified: Secondary | ICD-10-CM | POA: Diagnosis not present

## 2022-08-07 DIAGNOSIS — N959 Unspecified menopausal and perimenopausal disorder: Secondary | ICD-10-CM | POA: Diagnosis not present

## 2022-08-07 DIAGNOSIS — I10 Essential (primary) hypertension: Secondary | ICD-10-CM | POA: Diagnosis not present

## 2022-08-07 DIAGNOSIS — Z0000 Encounter for general adult medical examination without abnormal findings: Secondary | ICD-10-CM | POA: Diagnosis not present

## 2022-08-07 DIAGNOSIS — E7800 Pure hypercholesterolemia, unspecified: Secondary | ICD-10-CM | POA: Diagnosis not present

## 2022-08-22 NOTE — Radiation Completion Notes (Signed)
Patient Name: Amanda Pollard, Amanda Pollard MRN: [id number] Date of Birth: [birth year] Referring Physician: Griselda Miner, M.D. Date of Service: 2022-08-22 Radiation Oncologist: Eppie Gibson, M.D. 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 Staging on 2022-06-03: Squamous cell cancer of skin of nose T=cT1, N=cN0, M=cM0 Intent: Curative     ==========DELIVERED PLANS==========  First Treatment Date: 2022-06-25 - Last Treatment Date: 2022-07-23   Plan Name: HN_Nose_ECB Site: Nose Technique: Electron Mode: Electron Dose Per Fraction: 2.5 Gy Prescribed Dose (Delivered / Prescribed): 50 Gy / 50 Gy Prescribed Fxs (Delivered / Prescribed): 20 / 20     ==========ON TREATMENT VISIT DATES========== 2022-06-30, 2022-07-07, 2022-07-14, 2022-07-21     ==========UPCOMING VISITS==========       ==========APPENDIX - ON TREATMENT VISIT NOTES==========   PatEd 2022-06-30 Ongoing education performed.   ImpPlan 2022-06-30 The patient is tolerating radiation. Continue treatment as planned.   PhysExam 2022-06-30 Alert, no acute distress.   RunningNotes 2022-06-30 06-30-22 education performed   ProgNote 2022-06-30 Changes from last week/visit? [ No ] Pain? [ No ] Dysphagia? [ No ] Thick saliva/mouth irritation? [ No ] Mouth ulcers? [ No ] PEG tube? Any issues? [ no ] Have they received chemo (at any point during their radiation treatment)? [ No ] Taking anything by mouth or all via PEG? [ na ] How much clear fluid are they taking in? [ 71 0z of fluid ] Are they doing their salt/baking soda rinses? [ na ] Need refill on lotions? [ No ] Need refills: [ No ] Additional  Weekly Progress Notes [ none at this time, denies fatigue ]    PatEd 2022-07-07 Ongoing education performed.   PhysExam 2022-07-07 Alert, no acute distress.   ImpPlan 2022-07-07 The patient is  tolerating radiation. Continue treatment as planned.   ProgNote 2022-07-07 Changes from last week/visit? [ No ] Pain? [ No ] Dysphagia? [ No ] Thick saliva/mouth irritation? [ No ] Mouth ulcers? [ No ] PEG tube? Any issues? [ No ] Have they received chemo (at any point during their radiation treatment)? [ No ] Taking anything by mouth or all via PEG? [ na ] How much clear fluid are they taking in? [ 64 oz ] Are they doing their salt/baking soda rinses? [ No - nursing reinforced patient education ] Need refill on lotions? [ Yes ] Need refills: [ No ] Additional  Weekly Progress Notes [ a little more skin irritation this week ]    ImpPlan 2022-07-14 The patient is tolerating radiation. Continue treatment as planned.   PatEd 2022-07-14 Ongoing education performed.   PhysExam 2022-07-14 Alert, no acute distress.   ProgNote 2022-07-14 Changes from last week/visit? [ Yes, nose more red and irritated ] Pain? [ No ] Dysphagia? [ No ] Thick saliva/mouth irritation? [ No ] Mouth ulcers? [ No ] PEG tube? Any issues? [ no ] Have they received chemo (at any point during their radiation treatment)? [ no ] Taking anything by mouth or all via PEG? [ no ] How much clear fluid are they taking in? [ Evyn.Fought ] Are they doing their salt/baking soda rinses? [ No - nursing reinforced patient education ] Need refill on lotions? [ Yes ] Need refills: [ No ] Additional  Weekly  Progress Notes [ just skin changes this week ]    PatEd 2022-07-21 Ongoing education performed.   PhysExam 2022-07-21 Alert, no acute distress.   ImpPlan 2022-07-21 The patient is tolerating radiation. Continue treatment as planned.   ProgNote 2022-07-21 Changes from last week/visit? [ No ] Pain? [ No, mild ] Dysphagia? [ No ] Thick saliva/mouth irritation? [ No ] Mouth ulcers? [ No ] PEG tube? Any issues? [ No ] Have they received chemo (at any point during their radiation treatment)? [ No ] Taking anything by mouth or all via PEG? [  na ] How much clear fluid are they taking in? [ Evyn.Fought ] Are they doing their salt/baking soda rinses? [ na ] Need refill on lotions? [ Yes ] Need refills: [ No ] Additional  Weekly Progress Notes [ no major concerns.  ]

## 2022-08-26 NOTE — Progress Notes (Addendum)
Ms. Tolen presents to clinic today for follow up for completion of radiation treatment for nose cancer. She completed radiation on 07-23-22.   Pain issues, if any: no pain Using a feeding tube?: never had Weight changes, if any: none Wt Readings from Last 3 Encounters:  08/29/22 178 lb 3.2 oz (80.8 kg)  06/03/22 181 lb 6 oz (82.3 kg)  04/14/19 150 lb (68 kg)    Swallowing issues, if any: none Smoking or chewing tobacco? none Using fluoride trays daily? none Last ENT visit was on: none since treatment finished Other notable issues, if any: no concerns. Is the cancer gone?? Nose looks healed and doing very well.   Vitals:   08/29/22 1132  BP: (!) 143/75  Pulse: 77  Resp: 18  Temp: 98 F (36.7 C)  SpO2: 100%

## 2022-08-29 ENCOUNTER — Ambulatory Visit
Admission: RE | Admit: 2022-08-29 | Discharge: 2022-08-29 | Disposition: A | Discharge status: Home / Self Care | Payer: Medicare Other | Source: Ambulatory Visit | Attending: Radiation Oncology | Admitting: Radiation Oncology

## 2022-08-29 ENCOUNTER — Encounter: Payer: Self-pay | Admitting: Radiation Oncology

## 2022-08-29 VITALS — BP 143/75 | HR 77 | Temp 98.0°F | Resp 18 | Ht 63.0 in | Wt 178.2 lb

## 2022-08-29 DIAGNOSIS — C44321 Squamous cell carcinoma of skin of nose: Secondary | ICD-10-CM

## 2022-08-29 NOTE — Progress Notes (Signed)
Radiation Oncology         [phone] ________________________________  Name: Amanda Pollard MRN: [id number]  Date: 08/29/2022  DOB: 08/17/1951  Follow-Up Visit Note  Outpatient  CC: Lawerance Cruel, MD  Griselda Miner, MD  Diagnosis and Prior Radiotherapy:    ICD-10-CM   1. Squamous cell cancer of skin of nose  C44.321      Cancer Staging  Squamous cell cancer of skin of nose Staging form: Cutaneous Carcinoma of the Head and Neck, AJCC 8th Edition - Clinical stage from 06/03/2022: Stage I (cT1, cN0, cM0) - Signed by Eppie Gibson, MD on 06/03/2022 Stage prefix: Initial diagnosis Tumor location on head or neck: Skin of Nose Extraosseous extension: Absent  ==========DELIVERED PLANS==========  First Treatment Date: 2022-06-25 - Last Treatment Date: 2022-07-23   Plan Name: HN_Nose_ECB Site: Nose Technique: Electron Mode: Electron Dose Per Fraction: 2.5 Gy Prescribed Dose (Delivered / Prescribed): 50 Gy / 50 Gy Prescribed Fxs (Delivered / Prescribed): 20 / 20    CHIEF COMPLAINT: Here for follow-up and surveillance of nose skin  cancer  Narrative:  The patient returns today for routine follow-up.  Doing well and happy with how she healed  after post treatment moist desquamation.                           ALLERGIES:  has No Known Allergies.  Meds: Current Outpatient Medications  Medication Sig Dispense Refill   estradiol (ESTRACE) 0.5 MG tablet Take 1 tablet (0.5 mg total) by mouth daily. 90 tablet 4   fluorouracil (EFUDEX) 5 % cream Apply 1 Application topically at bedtime.     Multiple Vitamin (MULTIVITAMIN) tablet Take 1 tablet by mouth daily.     triamterene-hydrochlorothiazide (MAXZIDE-25) 37.5-25 MG per tablet Take 1 tablet by mouth daily. 30 tablet 0   valsartan (DIOVAN) 160 MG tablet Take 160 mg by mouth daily.     acitretin (SORIATANE) 10 MG capsule Take 10 mg by mouth daily.     Lidocaine HCl 4 % GEL Apply to affected area as needed to address pain.  (Patient not taking: Reported on 08/29/2022) 30 mL 1   valACYclovir (VALTREX) 500 MG tablet  (Patient not taking: Reported on 08/29/2022)     No current facility-administered medications for this encounter.    Physical Findings: The patient is in no acute distress. Patient is alert and oriented.  height is '5\' 3"'$  (1.6 m) and weight is 178 lb 3.2 oz (80.8 kg). Her oral temperature is 98 F (36.7 C). Her blood pressure is 143/75 (abnormal) and her pulse is 77. Her respiration is 18 and oxygen saturation is 100%. .    Nose has healed nicely - skin color has largely normalized and skin texture is consistent with the rest of her face. No evidence of residual cancer nor eye irritation.       Lab Findings: Lab Results  Component Value Date   WBC 11.2 (H) 12/16/2012   HGB 13.6 12/16/2012   HCT 40.6 12/16/2012   MCV 84.9 12/16/2012   PLT 347 12/16/2012    Radiographic Findings: No results found.  Impression/Plan:  She has healed well from RT to skin cancer of nose with no clinical evidence of residual disease.  We discussed sun hygiene in detail. She is looking forward to a cruise soon. She will follow closely with dermatology. I wished her the best and will see her PRN.  On date of service, in total,  I spent 20 minutes on this encounter. Patient was seen in person.  _____________________________________   Eppie Gibson, MD

## 2023-04-08 IMAGING — CT CT ABD-PELV W/O CM
1 series · 15 of 32 positions shown, 19 images · non-contrast
Comparison: None Available.

CLINICAL DATA: Microscopic hematuria, right flank pain



[Series 2: renal standard/full · axial · 0.98mm/px · z∈[-529,-104]mm · 15 of 96 slices shown, 19 images]
[im 7/96  soft-tissue]
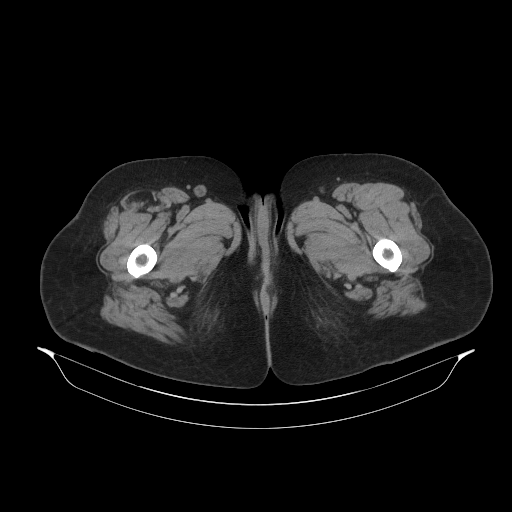
[im 7/96  bone]
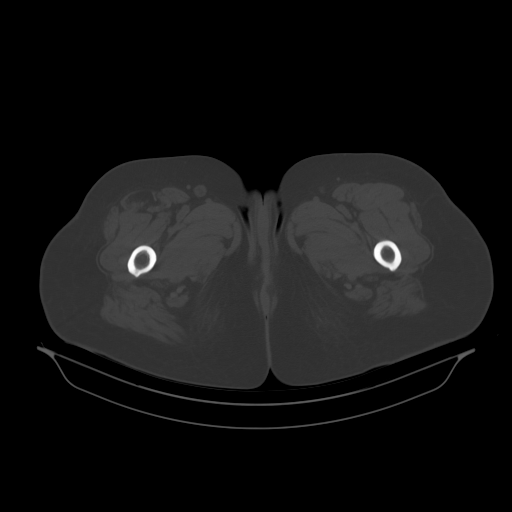
[im 13/96  soft-tissue]
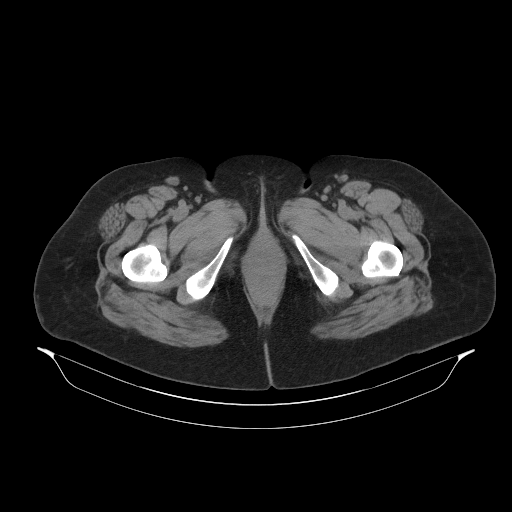
[im 19/96  soft-tissue]
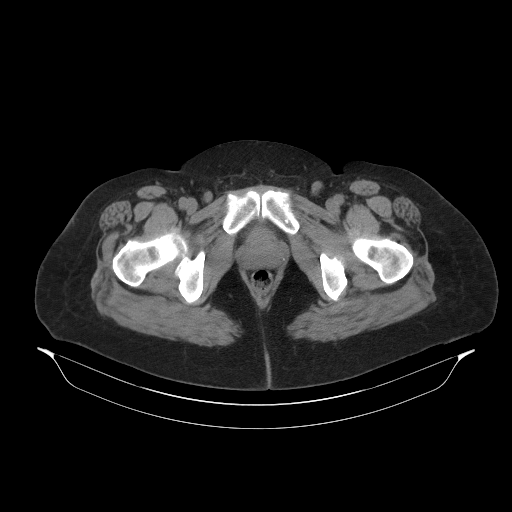
[im 28/96  soft-tissue]
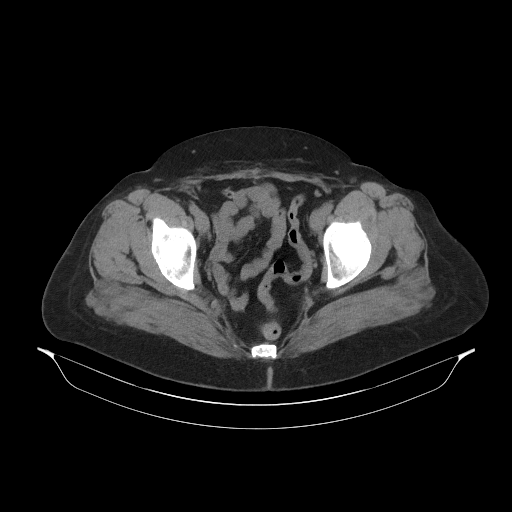
[im 34/96  soft-tissue]
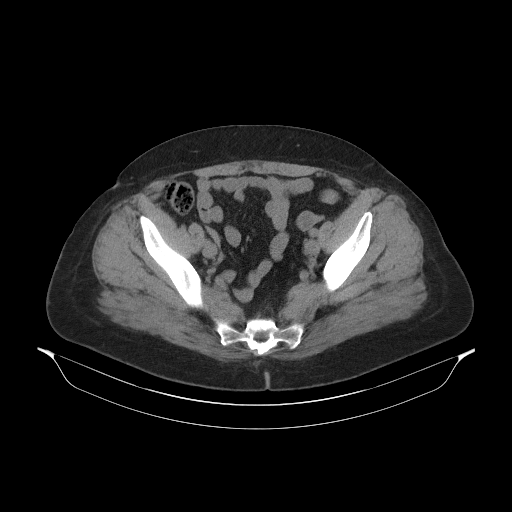
[im 40/96  soft-tissue]
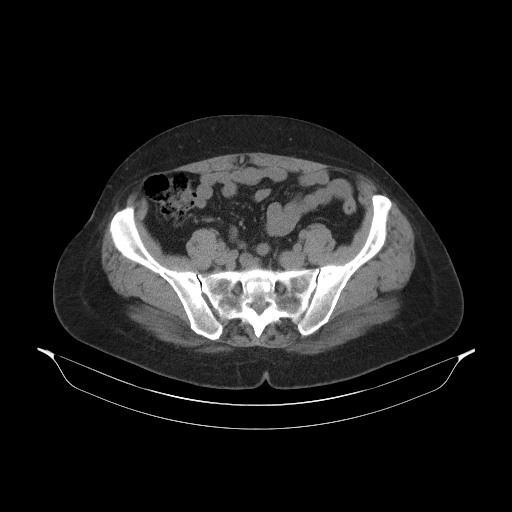
[im 50/96  soft-tissue]
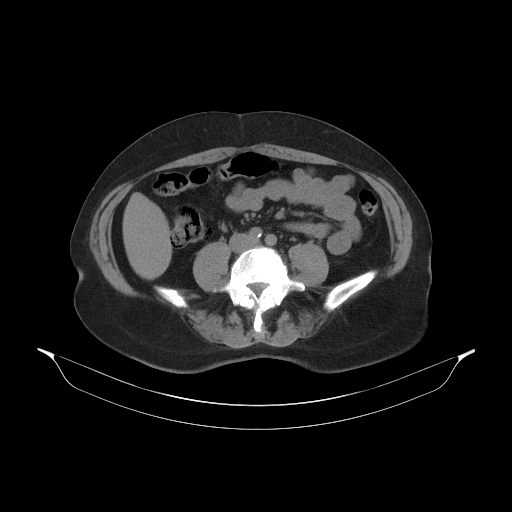
[im 56/96  soft-tissue]
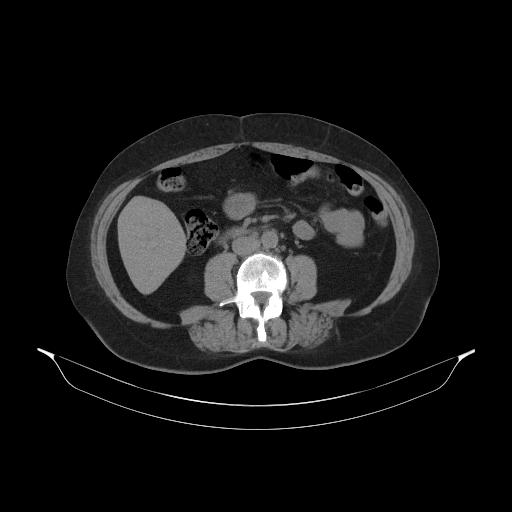
[im 62/96  soft-tissue]
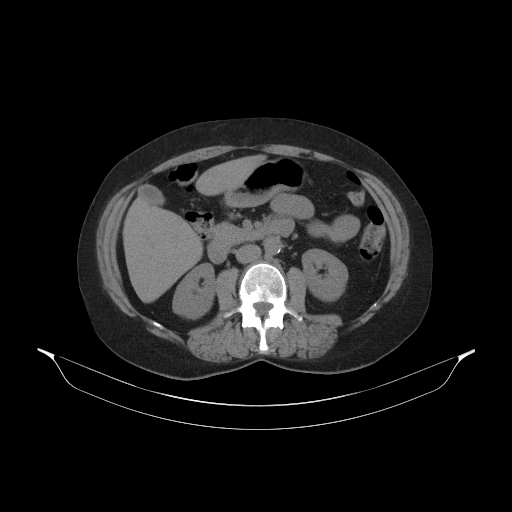
[im 62/96  bone]
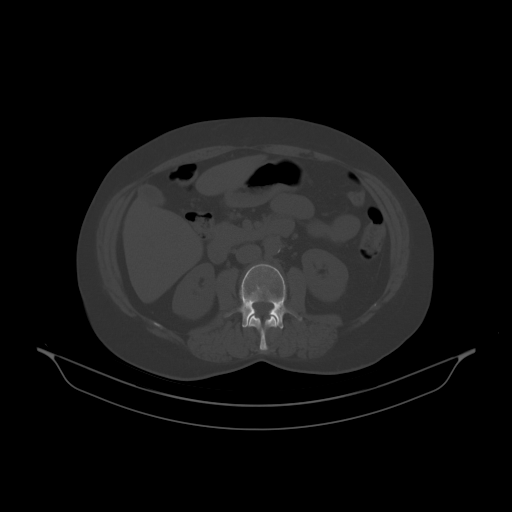
[im 68/96  soft-tissue]
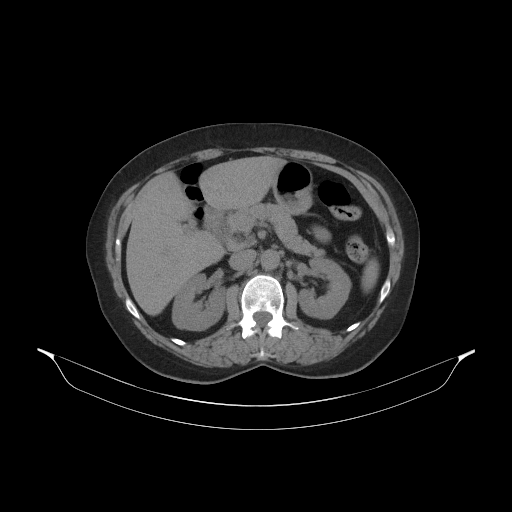
[im 77/96  soft-tissue]
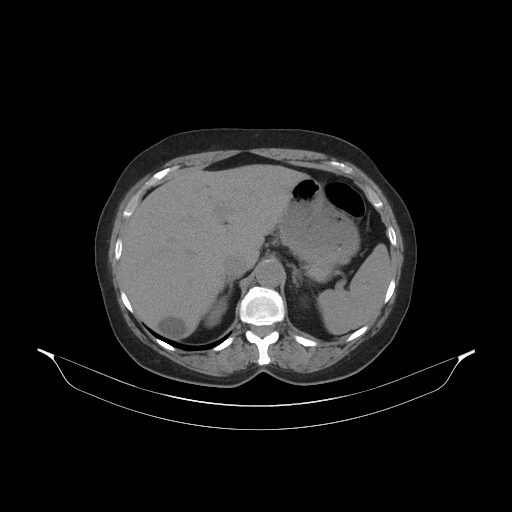
[im 83/96  soft-tissue]
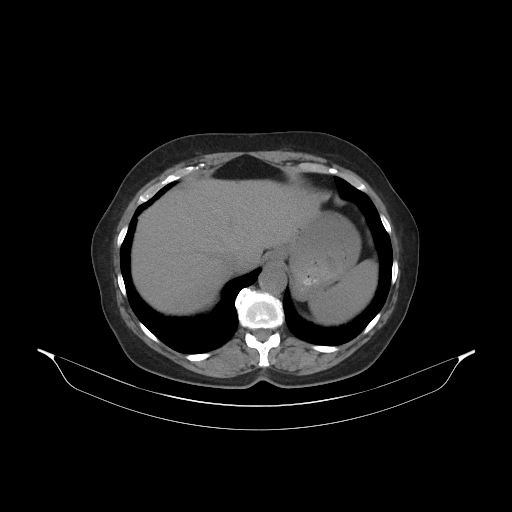
[im 83/96  lung]
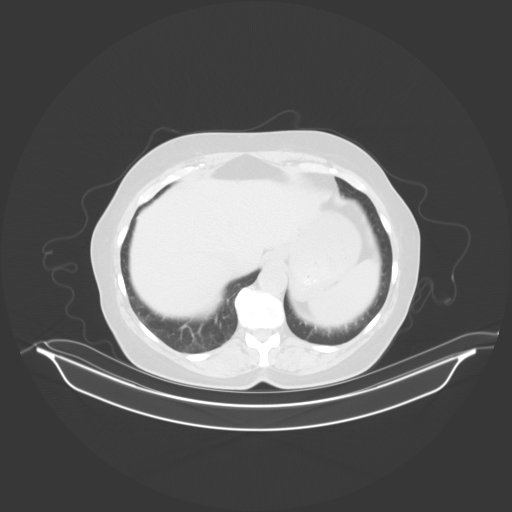
[im 86/96  lung]
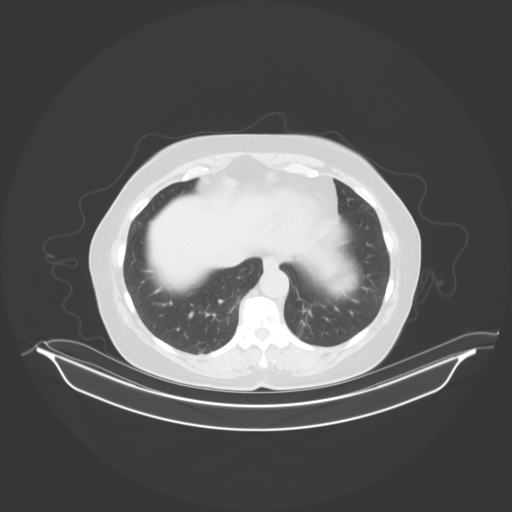
[im 89/96  soft-tissue]
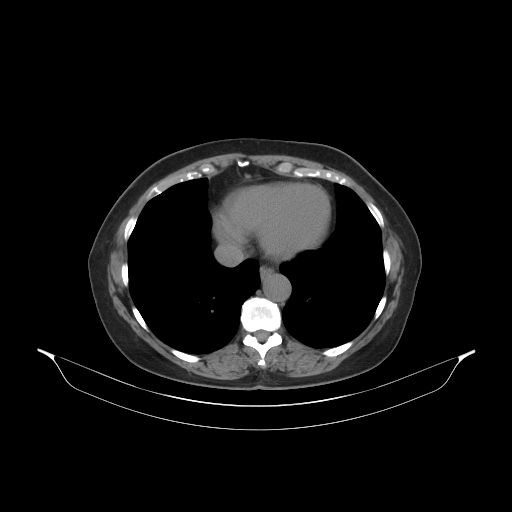
[im 89/96  lung]
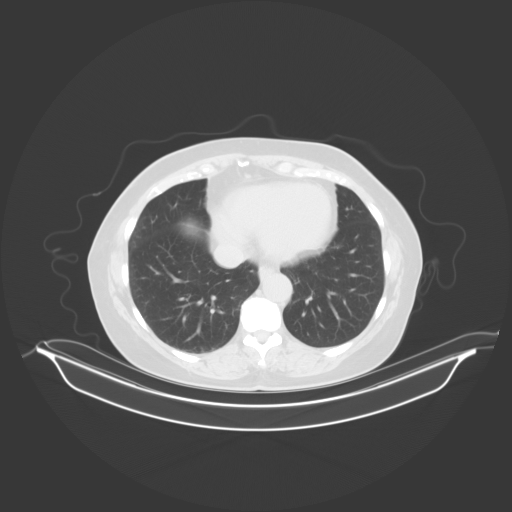
[im 92/96  lung]
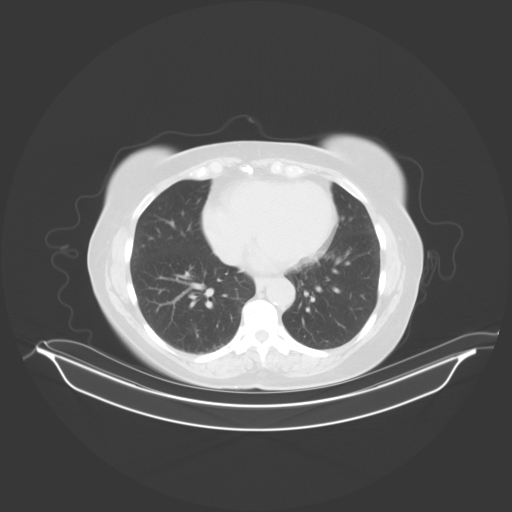

[15 of 32 positions shown; findings below may reference images not displayed]

FINDINGS: Lower chest: No acute findings

Hepatobiliary: Scattered hypodensities in the liver, the largest
posteriorly in the right hepatic lobe measuring 2.6 cm, likely
benign cysts. Gallbladder unremarkable.

Pancreas: No focal abnormality or ductal dilatation.

Spleen: No focal abnormality.  Normal size.

Adrenals/Urinary Tract: No adrenal abnormality. No focal renal
abnormality. No stones or hydronephrosis. Urinary bladder is
unremarkable.

Stomach/Bowel: Normal appendix. Stomach, large and small bowel
grossly unremarkable.

Vascular/Lymphatic: Aortic atherosclerosis. No evidence of aneurysm
or adenopathy.

Reproductive: Prior hysterectomy.  No adnexal masses.

Other: No free fluid or free air.

Musculoskeletal: No acute bony abnormality.
IMPRESSION: No renal or ureteral stones.  No hydronephrosis.

No acute findings.

Aortic atherosclerosis.

## 2023-05-13 DIAGNOSIS — Z1231 Encounter for screening mammogram for malignant neoplasm of breast: Secondary | ICD-10-CM | POA: Diagnosis not present

## 2023-05-18 DIAGNOSIS — L720 Epidermal cyst: Secondary | ICD-10-CM | POA: Diagnosis not present

## 2023-05-18 DIAGNOSIS — D485 Neoplasm of uncertain behavior of skin: Secondary | ICD-10-CM | POA: Diagnosis not present

## 2023-05-18 DIAGNOSIS — B078 Other viral warts: Secondary | ICD-10-CM | POA: Diagnosis not present

## 2023-05-18 DIAGNOSIS — L218 Other seborrheic dermatitis: Secondary | ICD-10-CM | POA: Diagnosis not present

## 2023-05-18 DIAGNOSIS — L570 Actinic keratosis: Secondary | ICD-10-CM | POA: Diagnosis not present

## 2023-05-18 DIAGNOSIS — I788 Other diseases of capillaries: Secondary | ICD-10-CM | POA: Diagnosis not present

## 2023-05-18 DIAGNOSIS — Z85828 Personal history of other malignant neoplasm of skin: Secondary | ICD-10-CM | POA: Diagnosis not present

## 2023-07-23 DIAGNOSIS — D0461 Carcinoma in situ of skin of right upper limb, including shoulder: Secondary | ICD-10-CM | POA: Diagnosis not present

## 2023-07-23 DIAGNOSIS — L570 Actinic keratosis: Secondary | ICD-10-CM | POA: Diagnosis not present

## 2023-07-23 DIAGNOSIS — L812 Freckles: Secondary | ICD-10-CM | POA: Diagnosis not present

## 2023-07-23 DIAGNOSIS — L821 Other seborrheic keratosis: Secondary | ICD-10-CM | POA: Diagnosis not present

## 2023-07-23 DIAGNOSIS — Z85828 Personal history of other malignant neoplasm of skin: Secondary | ICD-10-CM | POA: Diagnosis not present

## 2023-07-23 DIAGNOSIS — C44622 Squamous cell carcinoma of skin of right upper limb, including shoulder: Secondary | ICD-10-CM | POA: Diagnosis not present

## 2023-07-23 DIAGNOSIS — C44719 Basal cell carcinoma of skin of left lower limb, including hip: Secondary | ICD-10-CM | POA: Diagnosis not present

## 2023-07-23 DIAGNOSIS — D0471 Carcinoma in situ of skin of right lower limb, including hip: Secondary | ICD-10-CM | POA: Diagnosis not present

## 2023-07-23 DIAGNOSIS — D485 Neoplasm of uncertain behavior of skin: Secondary | ICD-10-CM | POA: Diagnosis not present

## 2023-08-25 DIAGNOSIS — I10 Essential (primary) hypertension: Secondary | ICD-10-CM | POA: Diagnosis not present

## 2023-08-25 DIAGNOSIS — E7800 Pure hypercholesterolemia, unspecified: Secondary | ICD-10-CM | POA: Diagnosis not present

## 2023-09-03 ENCOUNTER — Other Ambulatory Visit (HOSPITAL_BASED_OUTPATIENT_CLINIC_OR_DEPARTMENT_OTHER): Payer: Self-pay | Admitting: Family Medicine

## 2023-09-03 DIAGNOSIS — I10 Essential (primary) hypertension: Secondary | ICD-10-CM | POA: Diagnosis not present

## 2023-09-03 DIAGNOSIS — Z0000 Encounter for general adult medical examination without abnormal findings: Secondary | ICD-10-CM | POA: Diagnosis not present

## 2023-09-03 DIAGNOSIS — E7800 Pure hypercholesterolemia, unspecified: Secondary | ICD-10-CM | POA: Diagnosis not present

## 2023-10-01 DIAGNOSIS — L570 Actinic keratosis: Secondary | ICD-10-CM | POA: Diagnosis not present

## 2023-10-01 DIAGNOSIS — L905 Scar conditions and fibrosis of skin: Secondary | ICD-10-CM | POA: Diagnosis not present

## 2023-10-01 DIAGNOSIS — Z85828 Personal history of other malignant neoplasm of skin: Secondary | ICD-10-CM | POA: Diagnosis not present

## 2023-10-14 DIAGNOSIS — M5417 Radiculopathy, lumbosacral region: Secondary | ICD-10-CM | POA: Diagnosis not present

## 2023-10-14 DIAGNOSIS — M5136 Other intervertebral disc degeneration, lumbar region with discogenic back pain only: Secondary | ICD-10-CM | POA: Diagnosis not present

## 2023-10-14 DIAGNOSIS — M9905 Segmental and somatic dysfunction of pelvic region: Secondary | ICD-10-CM | POA: Diagnosis not present

## 2023-10-14 DIAGNOSIS — M9903 Segmental and somatic dysfunction of lumbar region: Secondary | ICD-10-CM | POA: Diagnosis not present

## 2023-10-14 DIAGNOSIS — M9902 Segmental and somatic dysfunction of thoracic region: Secondary | ICD-10-CM | POA: Diagnosis not present

## 2023-10-19 DIAGNOSIS — M5136 Other intervertebral disc degeneration, lumbar region with discogenic back pain only: Secondary | ICD-10-CM | POA: Diagnosis not present

## 2023-10-19 DIAGNOSIS — M5417 Radiculopathy, lumbosacral region: Secondary | ICD-10-CM | POA: Diagnosis not present

## 2023-10-19 DIAGNOSIS — M9905 Segmental and somatic dysfunction of pelvic region: Secondary | ICD-10-CM | POA: Diagnosis not present

## 2023-10-19 DIAGNOSIS — M9903 Segmental and somatic dysfunction of lumbar region: Secondary | ICD-10-CM | POA: Diagnosis not present

## 2023-10-19 DIAGNOSIS — M9902 Segmental and somatic dysfunction of thoracic region: Secondary | ICD-10-CM | POA: Diagnosis not present

## 2023-10-20 DIAGNOSIS — R519 Headache, unspecified: Secondary | ICD-10-CM | POA: Diagnosis not present

## 2023-10-20 DIAGNOSIS — R109 Unspecified abdominal pain: Secondary | ICD-10-CM | POA: Diagnosis not present

## 2023-10-20 DIAGNOSIS — R300 Dysuria: Secondary | ICD-10-CM | POA: Diagnosis not present

## 2023-10-22 DIAGNOSIS — R110 Nausea: Secondary | ICD-10-CM | POA: Diagnosis not present

## 2023-10-22 DIAGNOSIS — H9319 Tinnitus, unspecified ear: Secondary | ICD-10-CM | POA: Diagnosis not present

## 2023-10-22 DIAGNOSIS — I10 Essential (primary) hypertension: Secondary | ICD-10-CM | POA: Diagnosis not present

## 2023-10-22 DIAGNOSIS — Z09 Encounter for follow-up examination after completed treatment for conditions other than malignant neoplasm: Secondary | ICD-10-CM | POA: Diagnosis not present

## 2023-10-22 DIAGNOSIS — R8290 Unspecified abnormal findings in urine: Secondary | ICD-10-CM | POA: Diagnosis not present

## 2023-10-22 DIAGNOSIS — R519 Headache, unspecified: Secondary | ICD-10-CM | POA: Diagnosis not present

## 2023-10-22 DIAGNOSIS — E7800 Pure hypercholesterolemia, unspecified: Secondary | ICD-10-CM | POA: Diagnosis not present

## 2023-10-22 DIAGNOSIS — R899 Unspecified abnormal finding in specimens from other organs, systems and tissues: Secondary | ICD-10-CM | POA: Diagnosis not present

## 2023-10-22 DIAGNOSIS — K30 Functional dyspepsia: Secondary | ICD-10-CM | POA: Diagnosis not present

## 2023-10-28 DIAGNOSIS — M9903 Segmental and somatic dysfunction of lumbar region: Secondary | ICD-10-CM | POA: Diagnosis not present

## 2023-10-28 DIAGNOSIS — M9902 Segmental and somatic dysfunction of thoracic region: Secondary | ICD-10-CM | POA: Diagnosis not present

## 2023-10-28 DIAGNOSIS — M5136 Other intervertebral disc degeneration, lumbar region with discogenic back pain only: Secondary | ICD-10-CM | POA: Diagnosis not present

## 2023-10-28 DIAGNOSIS — M5417 Radiculopathy, lumbosacral region: Secondary | ICD-10-CM | POA: Diagnosis not present

## 2023-10-28 DIAGNOSIS — M9905 Segmental and somatic dysfunction of pelvic region: Secondary | ICD-10-CM | POA: Diagnosis not present

## 2023-10-29 DIAGNOSIS — M9902 Segmental and somatic dysfunction of thoracic region: Secondary | ICD-10-CM | POA: Diagnosis not present

## 2023-10-29 DIAGNOSIS — M9903 Segmental and somatic dysfunction of lumbar region: Secondary | ICD-10-CM | POA: Diagnosis not present

## 2023-10-29 DIAGNOSIS — M5417 Radiculopathy, lumbosacral region: Secondary | ICD-10-CM | POA: Diagnosis not present

## 2023-10-29 DIAGNOSIS — M5136 Other intervertebral disc degeneration, lumbar region with discogenic back pain only: Secondary | ICD-10-CM | POA: Diagnosis not present

## 2023-10-29 DIAGNOSIS — M9905 Segmental and somatic dysfunction of pelvic region: Secondary | ICD-10-CM | POA: Diagnosis not present

## 2023-11-02 DIAGNOSIS — M9902 Segmental and somatic dysfunction of thoracic region: Secondary | ICD-10-CM | POA: Diagnosis not present

## 2023-11-02 DIAGNOSIS — M5136 Other intervertebral disc degeneration, lumbar region with discogenic back pain only: Secondary | ICD-10-CM | POA: Diagnosis not present

## 2023-11-02 DIAGNOSIS — M5417 Radiculopathy, lumbosacral region: Secondary | ICD-10-CM | POA: Diagnosis not present

## 2023-11-02 DIAGNOSIS — M9905 Segmental and somatic dysfunction of pelvic region: Secondary | ICD-10-CM | POA: Diagnosis not present

## 2023-11-02 DIAGNOSIS — M9903 Segmental and somatic dysfunction of lumbar region: Secondary | ICD-10-CM | POA: Diagnosis not present

## 2023-11-04 DIAGNOSIS — M9905 Segmental and somatic dysfunction of pelvic region: Secondary | ICD-10-CM | POA: Diagnosis not present

## 2023-11-04 DIAGNOSIS — M9902 Segmental and somatic dysfunction of thoracic region: Secondary | ICD-10-CM | POA: Diagnosis not present

## 2023-11-04 DIAGNOSIS — M5417 Radiculopathy, lumbosacral region: Secondary | ICD-10-CM | POA: Diagnosis not present

## 2023-11-04 DIAGNOSIS — M9903 Segmental and somatic dysfunction of lumbar region: Secondary | ICD-10-CM | POA: Diagnosis not present

## 2023-11-04 DIAGNOSIS — M5136 Other intervertebral disc degeneration, lumbar region with discogenic back pain only: Secondary | ICD-10-CM | POA: Diagnosis not present

## 2023-11-05 DIAGNOSIS — I10 Essential (primary) hypertension: Secondary | ICD-10-CM | POA: Diagnosis not present

## 2023-11-05 DIAGNOSIS — N390 Urinary tract infection, site not specified: Secondary | ICD-10-CM | POA: Diagnosis not present

## 2023-11-05 DIAGNOSIS — Z09 Encounter for follow-up examination after completed treatment for conditions other than malignant neoplasm: Secondary | ICD-10-CM | POA: Diagnosis not present

## 2023-11-05 DIAGNOSIS — R519 Headache, unspecified: Secondary | ICD-10-CM | POA: Diagnosis not present

## 2023-11-11 DIAGNOSIS — M5417 Radiculopathy, lumbosacral region: Secondary | ICD-10-CM | POA: Diagnosis not present

## 2023-11-11 DIAGNOSIS — M9905 Segmental and somatic dysfunction of pelvic region: Secondary | ICD-10-CM | POA: Diagnosis not present

## 2023-11-11 DIAGNOSIS — M5136 Other intervertebral disc degeneration, lumbar region with discogenic back pain only: Secondary | ICD-10-CM | POA: Diagnosis not present

## 2023-11-11 DIAGNOSIS — M9902 Segmental and somatic dysfunction of thoracic region: Secondary | ICD-10-CM | POA: Diagnosis not present

## 2023-11-11 DIAGNOSIS — M9903 Segmental and somatic dysfunction of lumbar region: Secondary | ICD-10-CM | POA: Diagnosis not present

## 2023-11-25 DIAGNOSIS — M9902 Segmental and somatic dysfunction of thoracic region: Secondary | ICD-10-CM | POA: Diagnosis not present

## 2023-11-25 DIAGNOSIS — M5417 Radiculopathy, lumbosacral region: Secondary | ICD-10-CM | POA: Diagnosis not present

## 2023-11-25 DIAGNOSIS — M9905 Segmental and somatic dysfunction of pelvic region: Secondary | ICD-10-CM | POA: Diagnosis not present

## 2023-11-25 DIAGNOSIS — M5136 Other intervertebral disc degeneration, lumbar region with discogenic back pain only: Secondary | ICD-10-CM | POA: Diagnosis not present

## 2023-11-25 DIAGNOSIS — M9903 Segmental and somatic dysfunction of lumbar region: Secondary | ICD-10-CM | POA: Diagnosis not present

## 2023-12-31 DIAGNOSIS — L820 Inflamed seborrheic keratosis: Secondary | ICD-10-CM | POA: Diagnosis not present

## 2023-12-31 DIAGNOSIS — D0461 Carcinoma in situ of skin of right upper limb, including shoulder: Secondary | ICD-10-CM | POA: Diagnosis not present

## 2023-12-31 DIAGNOSIS — L821 Other seborrheic keratosis: Secondary | ICD-10-CM | POA: Diagnosis not present

## 2023-12-31 DIAGNOSIS — D0462 Carcinoma in situ of skin of left upper limb, including shoulder: Secondary | ICD-10-CM | POA: Diagnosis not present

## 2023-12-31 DIAGNOSIS — C44612 Basal cell carcinoma of skin of right upper limb, including shoulder: Secondary | ICD-10-CM | POA: Diagnosis not present

## 2023-12-31 DIAGNOSIS — C44629 Squamous cell carcinoma of skin of left upper limb, including shoulder: Secondary | ICD-10-CM | POA: Diagnosis not present

## 2023-12-31 DIAGNOSIS — D0472 Carcinoma in situ of skin of left lower limb, including hip: Secondary | ICD-10-CM | POA: Diagnosis not present

## 2023-12-31 DIAGNOSIS — Z85828 Personal history of other malignant neoplasm of skin: Secondary | ICD-10-CM | POA: Diagnosis not present

## 2023-12-31 DIAGNOSIS — L570 Actinic keratosis: Secondary | ICD-10-CM | POA: Diagnosis not present

## 2023-12-31 DIAGNOSIS — D485 Neoplasm of uncertain behavior of skin: Secondary | ICD-10-CM | POA: Diagnosis not present

## 2024-01-02 ENCOUNTER — Other Ambulatory Visit: Payer: Self-pay

## 2024-01-02 ENCOUNTER — Emergency Department (HOSPITAL_BASED_OUTPATIENT_CLINIC_OR_DEPARTMENT_OTHER)
Admission: EM | Admit: 2024-01-02 | Discharge: 2024-01-02 | Disposition: A | Discharge status: Home / Self Care | Attending: Emergency Medicine | Admitting: Emergency Medicine

## 2024-01-02 ENCOUNTER — Encounter (HOSPITAL_BASED_OUTPATIENT_CLINIC_OR_DEPARTMENT_OTHER): Payer: Self-pay

## 2024-01-02 DIAGNOSIS — L03116 Cellulitis of left lower limb: Secondary | ICD-10-CM | POA: Insufficient documentation

## 2024-01-02 DIAGNOSIS — L03114 Cellulitis of left upper limb: Secondary | ICD-10-CM | POA: Diagnosis not present

## 2024-01-02 MED ORDER — CEPHALEXIN 250 MG PO CAPS
500.0000 mg | ORAL_CAPSULE | Freq: Once | ORAL | Status: AC
  Administered 2024-01-02: 500 mg via ORAL
  Filled 2024-01-02: qty 2

## 2024-01-02 MED ORDER — CEPHALEXIN 500 MG PO CAPS: 500.0000 mg | ORAL_CAPSULE | Freq: Two times a day (BID) | ORAL | 0 refills | Status: AC

## 2024-01-02 NOTE — Discharge Instructions (Signed)
 You have been prescribed an antibiotic called cephalexin to treat your cellulitis. Take this antibiotic 2 times a day for the next 10 days. Take the full course of your antibiotic even if you start feeling better. Antibiotics may cause you to have diarrhea.  You were given your first dose here today.  Please continue to take your doxycycline as prescribed by your dermatologist.  You may take these medications at the same time.  Please follow-up with your dermatologist within the next 3 days for recheck of symptoms.  Return to the ER for any fevers, chills, spreading redness, red streaking, any other new or concerning symptoms

## 2024-01-02 NOTE — ED Triage Notes (Signed)
 Patient arrives with complaints of suspected cellulitis to left foot. Patient had "skin cancers" remover earlier this week and the site to her left foot is red/swollen.

## 2024-01-02 NOTE — ED Provider Notes (Signed)
 [place] [facility] Provider Note   CSN: [id number] Arrival date & time: 01/02/24  1026     History  Chief Complaint  Patient presents with   Skin Problem    Amanda Pollard is a 73 y.o. 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  There is 1 site on the left foot with surrounding redness and blistering.  Amanda Pollard is also concerned for a site on her left forearm with some surrounding redness.  States Amanda Pollard was prescribed doxycycline by her dermatologist and Amanda Pollard has taken 2 doses of this without improvement.  Denies any fever or chills.  HPI     Home Medications Prior to Admission medications   Medication Sig Start Date End Date Taking? Authorizing Provider  cephALEXin (KEFLEX) 500 MG capsule Take 1 capsule (500 mg total) by mouth 2 (two) times daily for 10 days. 01/02/24 01/12/24 Yes Rexie Catena, PA-C  acitretin (SORIATANE) 10 MG capsule Take 10 mg by mouth daily. 05/15/22   [provider]  estradiol  (ESTRACE ) 0.5 MG tablet Take 1 tablet (0.5 mg total) by mouth daily. 03/08/19   Lavoie, Marie-Lyne, MD  fluorouracil (EFUDEX) 5 % cream Apply 1 Application topically at bedtime. 05/15/22   [provider]  Lidocaine  HCl 4 % GEL Apply to affected area as needed to address pain. Patient not taking: Reported on 08/29/2022 07/14/22   Colie Dawes, MD  Multiple Vitamin (MULTIVITAMIN) tablet Take 1 tablet by mouth daily.    [provider]  triamterene -hydrochlorothiazide (MAXZIDE-25) 37.5-25 MG per tablet Take 1 tablet by mouth daily. 12/16/12   Fontaine, Fatima Honour, MD  valACYclovir (VALTREX) 500 MG tablet  04/12/19   [provider]  valsartan (DIOVAN) 160 MG tablet Take 160 mg by mouth daily. 03/04/22   [provider]      Allergies    Lisinopril    Review of Systems   Review of Systems   Constitutional:  Negative for chills and fever.    Physical Exam Updated Vital Signs BP (!) 166/75 (BP Location: Right Arm)   Pulse 78   Temp 98.4 F (36.9 C) (Oral)   Resp 19   Ht 5\' 3"  (1.6 m)   SpO2 100%   BMI 31.57 kg/m  Physical Exam Vitals and nursing note reviewed.  Constitutional:      Appearance: Normal appearance.  HENT:     Head: Atraumatic.  Cardiovascular:     Rate and Rhythm: Normal rate and regular rhythm.     Comments: 2+ radial pulse bilaterally 2+ dorsalis pedis pulse bilaterally Pulmonary:     Effort: Pulmonary effort is normal.  Musculoskeletal:     Comments: Full range of motion of the left ankle, left wrist, left elbow  Skin:    Comments: Dorsum of the left foot with ulceration where her squamous cell carcinoma was removed.  No purulent drainage from the site.  There is surrounding erythema and edema extending approximately 1 cm in diameter around this ulceration.  There is also a large blister with serous fluid just distal to the ulceration.   Anterior surface of left forearm with ulceration from removal of squamous cell carcinoma.  Ulceration without any purulent drainage.  There is surrounding erythema extending about 3 cm out from the removal site.  No red streaking  Neurological:     General: No focal deficit present.  Mental Status: Amanda Pollard is alert.  Psychiatric:        Mood and Affect: Mood normal.        Behavior: Behavior normal.       ED Results / Procedures / Treatments   Labs (all labs ordered are listed, but only abnormal results are displayed) Labs Reviewed - No data to display  EKG None  Radiology No results found.  Procedures Procedures    Medications Ordered in ED Medications  cephALEXin (KEFLEX) capsule 500 mg (500 mg Oral Given 01/02/24 1145)    ED Course/ Medical Decision Making/ A&P                                 Medical Decision Making Risk Prescription drug management.     Differential diagnosis  includes but is not limited to cellulitis, necrotizing fasciitis, medication reaction, contact dermatitis, abscess, systemic infection  ED Course:  Upon initial evaluation, patient is well-appearing, stable vital signs aside from elevated blood pressure of 166/75.  Afebrile, not tachycardic.  Amanda Pollard had squamous cell carcinoma spots removed 3 days ago by her dermatologist, now with some erythema around these removal sites on the left foot and left forearm.  No purulent drainage from these sites.  No red streaking.  No areas of fluctuance to suggest abscess.  Neurovascularly intact in the bilateral upper and lower extremities.  No systemic symptoms such as fever chills, no fever, no tachycardia, low suspicion for any systemic infection.  Visual appearance of these sites are consistent with cellulitis.  No new soaps, lotions, to suggest contact dermatitis.  Amanda Pollard is taken two tablets of doxycycline already, will add on Cephalexin for additional coverage. Amanda Pollard has not had 48 hours of antibiotic therapy, has not failed outpatient therapy at this time and no indication for hospitalization or IV antibiotics.  Case discussed with attending Dr. Tamela Fake who is in agreement with plan of care.  Feels patient is prepared for discharge home with strict return precautions.    Medications Given: Cephalexin for cellulitis    Impression: Cellulitis of left foot and left forearm  Disposition:  The patient was discharged home with instructions to continue taking her full course of doxycycline as prescribed by her dermatologist.  Add on 10-day course of cephalexin as prescribed.  Follow-up with dermatologist within the next 3 days for recheck of symptoms. Return precautions given.    Record Review: External records from outside source obtained and reviewed including dermatology notes     This chart was dictated using voice recognition software, Dragon. Despite the best efforts of this provider to proofread  and correct errors, errors may still occur which can change documentation meaning.          Final Clinical Impression(s) / ED Diagnoses Final diagnoses:  Cellulitis of left foot  Cellulitis of left forearm    Rx / DC Orders ED Discharge Orders          Ordered    cephALEXin (KEFLEX) 500 MG capsule  2 times daily        01/02/24 1157              Rexie Catena, PA-C 01/02/24 1203    Scarlette Currier, MD 01/03/24 [id number]

## 2024-01-02 NOTE — ED Notes (Signed)
 Pt had carcinoma taken out of L dorsal foot. Has blister distal to excision site about the size of a golf ball. Redness localized to region of same size, consistent with cellulitic irritation. Warm to touch, painful to same. No current oozing. Pt also endorses rash to L medial forearm near two other excision sites. No fevers.

## 2024-02-08 DIAGNOSIS — M25511 Pain in right shoulder: Secondary | ICD-10-CM | POA: Diagnosis not present

## 2024-02-24 DIAGNOSIS — S46011D Strain of muscle(s) and tendon(s) of the rotator cuff of right shoulder, subsequent encounter: Secondary | ICD-10-CM | POA: Diagnosis not present

## 2024-02-24 DIAGNOSIS — M6281 Muscle weakness (generalized): Secondary | ICD-10-CM | POA: Diagnosis not present

## 2024-02-24 DIAGNOSIS — M25611 Stiffness of right shoulder, not elsewhere classified: Secondary | ICD-10-CM | POA: Diagnosis not present

## 2024-03-09 DIAGNOSIS — M6281 Muscle weakness (generalized): Secondary | ICD-10-CM | POA: Diagnosis not present

## 2024-03-09 DIAGNOSIS — S46011D Strain of muscle(s) and tendon(s) of the rotator cuff of right shoulder, subsequent encounter: Secondary | ICD-10-CM | POA: Diagnosis not present

## 2024-03-09 DIAGNOSIS — M25611 Stiffness of right shoulder, not elsewhere classified: Secondary | ICD-10-CM | POA: Diagnosis not present

## 2024-03-16 DIAGNOSIS — M6281 Muscle weakness (generalized): Secondary | ICD-10-CM | POA: Diagnosis not present

## 2024-03-16 DIAGNOSIS — S46011D Strain of muscle(s) and tendon(s) of the rotator cuff of right shoulder, subsequent encounter: Secondary | ICD-10-CM | POA: Diagnosis not present

## 2024-03-16 DIAGNOSIS — M25611 Stiffness of right shoulder, not elsewhere classified: Secondary | ICD-10-CM | POA: Diagnosis not present

## 2024-03-29 DIAGNOSIS — M25611 Stiffness of right shoulder, not elsewhere classified: Secondary | ICD-10-CM | POA: Diagnosis not present

## 2024-03-29 DIAGNOSIS — M6281 Muscle weakness (generalized): Secondary | ICD-10-CM | POA: Diagnosis not present

## 2024-03-29 DIAGNOSIS — S46011D Strain of muscle(s) and tendon(s) of the rotator cuff of right shoulder, subsequent encounter: Secondary | ICD-10-CM | POA: Diagnosis not present

## 2024-04-04 DIAGNOSIS — M25511 Pain in right shoulder: Secondary | ICD-10-CM | POA: Diagnosis not present

## 2024-04-05 DIAGNOSIS — M25511 Pain in right shoulder: Secondary | ICD-10-CM | POA: Diagnosis not present

## 2024-04-07 DIAGNOSIS — H524 Presbyopia: Secondary | ICD-10-CM | POA: Diagnosis not present

## 2024-04-08 DIAGNOSIS — M25511 Pain in right shoulder: Secondary | ICD-10-CM | POA: Diagnosis not present

## 2024-04-21 DIAGNOSIS — S46011D Strain of muscle(s) and tendon(s) of the rotator cuff of right shoulder, subsequent encounter: Secondary | ICD-10-CM | POA: Diagnosis not present

## 2024-04-21 DIAGNOSIS — M25611 Stiffness of right shoulder, not elsewhere classified: Secondary | ICD-10-CM | POA: Diagnosis not present

## 2024-04-21 DIAGNOSIS — M6281 Muscle weakness (generalized): Secondary | ICD-10-CM | POA: Diagnosis not present

## 2024-04-28 DIAGNOSIS — L570 Actinic keratosis: Secondary | ICD-10-CM | POA: Diagnosis not present

## 2024-04-28 DIAGNOSIS — C44719 Basal cell carcinoma of skin of left lower limb, including hip: Secondary | ICD-10-CM | POA: Diagnosis not present

## 2024-04-28 DIAGNOSIS — Z85828 Personal history of other malignant neoplasm of skin: Secondary | ICD-10-CM | POA: Diagnosis not present

## 2024-04-28 DIAGNOSIS — C44729 Squamous cell carcinoma of skin of left lower limb, including hip: Secondary | ICD-10-CM | POA: Diagnosis not present

## 2024-04-28 DIAGNOSIS — C44712 Basal cell carcinoma of skin of right lower limb, including hip: Secondary | ICD-10-CM | POA: Diagnosis not present

## 2024-05-18 DIAGNOSIS — Z1231 Encounter for screening mammogram for malignant neoplasm of breast: Secondary | ICD-10-CM | POA: Diagnosis not present

## 2024-06-03 DIAGNOSIS — M7521 Bicipital tendinitis, right shoulder: Secondary | ICD-10-CM | POA: Diagnosis not present

## 2024-06-03 DIAGNOSIS — M75111 Incomplete rotator cuff tear or rupture of right shoulder, not specified as traumatic: Secondary | ICD-10-CM | POA: Diagnosis not present

## 2024-07-11 ENCOUNTER — Ambulatory Visit (HOSPITAL_COMMUNITY)
Admission: RE | Admit: 2024-07-11 | Discharge: 2024-07-11 | Disposition: A | Discharge status: Home / Self Care | Source: Ambulatory Visit | Attending: Cardiovascular Disease | Admitting: Cardiovascular Disease

## 2024-07-11 DIAGNOSIS — Z0000 Encounter for general adult medical examination without abnormal findings: Secondary | ICD-10-CM | POA: Insufficient documentation

## 2024-07-25 ENCOUNTER — Other Ambulatory Visit (HOSPITAL_COMMUNITY)
